# Patient Record
Sex: Female | Born: 1970
Health system: Southern US, Community
[De-identification: ages and names within clinical notes are randomized; demographics above are authoritative.]

## PROBLEM LIST (undated history)

## (undated) DIAGNOSIS — M1990 Unspecified osteoarthritis, unspecified site: Secondary | ICD-10-CM

## (undated) DIAGNOSIS — E785 Hyperlipidemia, unspecified: Secondary | ICD-10-CM

## (undated) DIAGNOSIS — Z789 Other specified health status: Secondary | ICD-10-CM

## (undated) DIAGNOSIS — O139 Gestational [pregnancy-induced] hypertension without significant proteinuria, unspecified trimester: Secondary | ICD-10-CM

## (undated) DIAGNOSIS — F419 Anxiety disorder, unspecified: Secondary | ICD-10-CM

## (undated) DIAGNOSIS — F32A Depression, unspecified: Secondary | ICD-10-CM

## (undated) HISTORY — DX: Depression, unspecified: F32.A

## (undated) HISTORY — PX: WISDOM TOOTH EXTRACTION: SHX21

## (undated) HISTORY — DX: Hyperlipidemia, unspecified: E78.5

## (undated) HISTORY — PX: BACK SURGERY: SHX140

## (undated) HISTORY — DX: Unspecified osteoarthritis, unspecified site: M19.90

## (undated) HISTORY — DX: Anxiety disorder, unspecified: F41.9

## (undated) HISTORY — PX: FRACTURE SURGERY: SHX138

---

## 1999-04-23 HISTORY — PX: EYE SURGERY: SHX253

## 2009-07-07 ENCOUNTER — Encounter: Admission: RE | Admit: 2009-07-07 | Discharge: 2009-07-07 | Payer: Self-pay | Admitting: Neurosurgery

## 2009-10-20 HISTORY — PX: LUMBAR FUSION: SHX111

## 2009-11-01 ENCOUNTER — Ambulatory Visit (HOSPITAL_COMMUNITY): Admission: RE | Admit: 2009-11-01 | Discharge: 2009-11-02 | Payer: Self-pay | Admitting: Neurosurgery

## 2010-05-13 ENCOUNTER — Encounter: Payer: Self-pay | Admitting: Neurosurgery

## 2010-05-13 ENCOUNTER — Encounter: Payer: Self-pay | Admitting: Chiropractor

## 2010-07-08 LAB — BASIC METABOLIC PANEL
BUN: 7 mg/dL (ref 6–23)
CO2: 26 mEq/L (ref 19–32)
Calcium: 8.9 mg/dL (ref 8.4–10.5)
Chloride: 105 mEq/L (ref 96–112)
Creatinine, Ser: 0.71 mg/dL (ref 0.4–1.2)
GFR calc Af Amer: >60 mL/min (ref >60)
GFR calc non Af Amer: >60 mL/min (ref >60)
Glucose, Bld: 101 mg/dL — ABNORMAL HIGH (ref 70–99)
Potassium: 4 mEq/L (ref 3.5–5.1)
Sodium: 137 mEq/L (ref 135–145)

## 2010-07-08 LAB — ABO/RH: ABO/RH(D): O POS

## 2010-07-08 LAB — TYPE AND SCREEN
ABO/RH(D): O POS
Antibody Screen: NEGATIVE

## 2010-07-08 LAB — SURGICAL PCR SCREEN
MRSA, PCR: NEGATIVE
Staphylococcus aureus: NEGATIVE

## 2010-07-08 LAB — CBC
HCT: 39.8 % (ref 36.0–46.0)
Hemoglobin: 13.6 g/dL (ref 12.0–15.0)
MCH: 33.5 pg (ref 26.0–34.0)
MCHC: 34.2 g/dL (ref 30.0–36.0)
MCV: 97.9 fL (ref 78.0–100.0)
Platelets: 262 10*3/uL (ref 150–400)
RBC: 4.07 MIL/uL (ref 3.87–5.11)
RDW: 13.5 % (ref 11.5–15.5)
WBC: 6.1 10*3/uL (ref 4.0–10.5)

## 2010-07-08 LAB — HCG, SERUM, QUALITATIVE: Preg, Serum: NEGATIVE

## 2010-07-22 HISTORY — PX: ORIF CLAVICLE FRACTURE: SUR924

## 2010-08-12 ENCOUNTER — Emergency Department: Payer: Self-pay | Admitting: Emergency Medicine

## 2010-08-15 ENCOUNTER — Ambulatory Visit (HOSPITAL_COMMUNITY): Payer: No Typology Code available for payment source

## 2010-08-15 ENCOUNTER — Ambulatory Visit (HOSPITAL_COMMUNITY)
Admission: RE | Admit: 2010-08-15 | Discharge: 2010-08-15 | Disposition: A | Discharge status: Home / Self Care | Payer: No Typology Code available for payment source | Source: Ambulatory Visit | Attending: Orthopedic Surgery | Admitting: Orthopedic Surgery

## 2010-08-15 DIAGNOSIS — Y92410 Unspecified street and highway as the place of occurrence of the external cause: Secondary | ICD-10-CM | POA: Insufficient documentation

## 2010-08-15 DIAGNOSIS — S42023A Displaced fracture of shaft of unspecified clavicle, initial encounter for closed fracture: Secondary | ICD-10-CM | POA: Insufficient documentation

## 2010-08-15 LAB — CBC
MCH: 31.7 pg (ref 26.0–34.0)
WBC: 8.1 10*3/uL (ref 4.0–10.5)

## 2010-08-15 LAB — URINALYSIS, ROUTINE W REFLEX MICROSCOPIC
Glucose, UA: NEGATIVE mg/dL
Hgb urine dipstick: NEGATIVE

## 2010-08-15 LAB — BASIC METABOLIC PANEL
BUN: 8 mg/dL (ref 6–23)
Calcium: 9.2 mg/dL (ref 8.4–10.5)
Chloride: 106 mEq/L (ref 96–112)
Creatinine, Ser: 0.71 mg/dL (ref 0.4–1.2)
GFR calc Af Amer: >60 mL/min (ref >60)
GFR calc non Af Amer: >60 mL/min (ref >60)
Potassium: 4.3 mEq/L (ref 3.5–5.1)
Sodium: 136 mEq/L (ref 135–145)

## 2010-08-15 LAB — DIFFERENTIAL
Basophils Absolute: 0 10*3/uL (ref 0.0–0.1)
Eosinophils Absolute: 0.3 10*3/uL (ref 0.0–0.7)
Lymphocytes Relative: 22 % (ref 12–46)
Monocytes Absolute: 0.6 10*3/uL (ref 0.1–1.0)
Monocytes Relative: 7 % (ref 3–12)

## 2010-08-15 LAB — SURGICAL PCR SCREEN: MRSA, PCR: NEGATIVE

## 2010-08-16 LAB — URINE CULTURE: Colony Count: NO GROWTH
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
## 2011-01-14 ENCOUNTER — Other Ambulatory Visit (HOSPITAL_COMMUNITY): Payer: Self-pay | Admitting: Gynecology

## 2011-01-14 DIAGNOSIS — N979 Female infertility, unspecified: Secondary | ICD-10-CM

## 2011-01-17 ENCOUNTER — Ambulatory Visit (HOSPITAL_COMMUNITY)
Admission: RE | Admit: 2011-01-17 | Discharge: 2011-01-17 | Disposition: A | Discharge status: Home / Self Care | Payer: BC Managed Care – PPO | Source: Ambulatory Visit | Attending: Gynecology | Admitting: Gynecology

## 2011-01-17 DIAGNOSIS — N979 Female infertility, unspecified: Secondary | ICD-10-CM

## 2011-01-17 MED ORDER — IOHEXOL 300 MG/ML  SOLN: 10.0000 mL | Freq: Once | INTRAMUSCULAR | Status: AC | PRN

## 2011-08-26 ENCOUNTER — Encounter (HOSPITAL_COMMUNITY): Payer: Self-pay | Admitting: Pharmacy Technician
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
## 2011-08-30 ENCOUNTER — Encounter (HOSPITAL_COMMUNITY)
Admission: RE | Admit: 2011-08-30 | Discharge: 2011-08-30 | Disposition: A | Discharge status: Home / Self Care | Payer: No Typology Code available for payment source | Source: Ambulatory Visit | Attending: Orthopedic Surgery | Admitting: Orthopedic Surgery

## 2011-08-30 ENCOUNTER — Encounter (HOSPITAL_COMMUNITY): Payer: Self-pay

## 2011-08-30 HISTORY — DX: Other specified health status: Z78.9

## 2011-08-30 LAB — CBC
HCT: 39.1 % (ref 36.0–46.0)
MCHC: 33.5 g/dL (ref 30.0–36.0)
MCV: 92.9 fL (ref 78.0–100.0)
RDW: 13.1 % (ref 11.5–15.5)

## 2011-08-30 NOTE — Pre-Procedure Instructions (Signed)
20 Sahily Biddle  08/30/2011   Your procedure is scheduled on:  Friday, May 17  Report to [facility] at 0530 AM.  Call this number if you have problems the morning of surgery: [phone]   Remember:   Do not eat food:After Midnight.  May have clear liquids: up to 4 Hours before arrival.  Clear liquids include soda, tea, black coffee, apple or grape juice, broth.  Take these medicines the morning of surgery with A SIP OF WATER: *none**   Do not wear jewelry, make-up or nail polish.  Do not wear lotions, powders, or perfumes. You may wear deodorant.  Do not shave 48 hours prior to surgery.  Do not bring valuables to the hospital.  Contacts, dentures or bridgework may not be worn into surgery.  Leave suitcase in the car. After surgery it may be brought to your room.  For patients admitted to the hospital, checkout time is 11:00 AM the day of discharge.   Patients discharged the day of surgery will not be allowed to drive home.  Name and phone number of your driver: *spouse, Luisa Hart**  Special Instructions: CHG Shower Use Special Wash: 1/2 bottle night before surgery and 1/2 bottle morning of surgery.   Please read over the following fact sheets that you were given: Pain Booklet, Coughing and Deep Breathing, MRSA Information and Surgical Site Infection Prevention

## 2011-08-30 NOTE — Pre-Procedure Instructions (Signed)
20 [name]  08/30/2011   Your procedure is scheduled on:  Fri, May 17 @ 7:30 AM              Report to [facility] at 5:30 AM   Call this number if you have problems the morning of surgery: [phone]   Remember:   Do not eat food:After Midnight.  May have clear liquids: up to 4 Hours before arrival.(until 1:30 am)  Clear liquids include soda, tea, black coffee, apple or grape juice, broth,water    Do not wear jewelry, make-up or nail polish.  Do not wear lotions, powders, or perfumes.   Do not shave 48 hours prior to surgery.  Do not bring valuables to the hospital.  Contacts, dentures or bridgework may not be worn into surgery.  Leave suitcase in the car. After surgery it may be brought to your room.  For patients admitted to the hospital, checkout time is 11:00 AM the day of discharge.   Patients discharged the day of surgery will not be allowed to drive home.

## 2011-09-05 MED ORDER — CEFAZOLIN SODIUM-DEXTROSE 2-3 GM-% IV SOLR
2.0000 g | INTRAVENOUS | Status: AC
  Administered 2011-09-06: 2 g via INTRAVENOUS
  Filled 2011-09-05: qty 50

## 2011-09-06 ENCOUNTER — Ambulatory Visit (HOSPITAL_COMMUNITY)
Admission: RE | Admit: 2011-09-06 | Discharge: 2011-09-06 | Disposition: A | Discharge status: Home / Self Care | Payer: Self-pay | Source: Ambulatory Visit | Attending: Orthopedic Surgery | Admitting: Orthopedic Surgery

## 2011-09-06 ENCOUNTER — Encounter (HOSPITAL_COMMUNITY): Payer: Self-pay | Admitting: Anesthesiology

## 2011-09-06 ENCOUNTER — Encounter (HOSPITAL_COMMUNITY)
Admission: RE | Disposition: A | Discharge status: Home / Self Care | Payer: Self-pay | Source: Ambulatory Visit | Attending: Orthopedic Surgery

## 2011-09-06 ENCOUNTER — Encounter (HOSPITAL_COMMUNITY): Payer: Self-pay | Admitting: *Deleted

## 2011-09-06 ENCOUNTER — Ambulatory Visit (HOSPITAL_COMMUNITY): Payer: Self-pay | Admitting: Anesthesiology

## 2011-09-06 DIAGNOSIS — Z472 Encounter for removal of internal fixation device: Secondary | ICD-10-CM | POA: Insufficient documentation

## 2011-09-06 DIAGNOSIS — M25519 Pain in unspecified shoulder: Secondary | ICD-10-CM | POA: Insufficient documentation

## 2011-09-06 DIAGNOSIS — Z01812 Encounter for preprocedural laboratory examination: Secondary | ICD-10-CM | POA: Insufficient documentation

## 2011-09-06 HISTORY — PX: HARDWARE REMOVAL: SHX979

## 2011-09-06 SURGERY — REMOVAL, HARDWARE
Anesthesia: General | Site: Shoulder | Laterality: Right | Wound class: Clean

## 2011-09-06 MED ORDER — SODIUM CHLORIDE 0.9 % IV SOLN: INTRAVENOUS | Status: DC

## 2011-09-06 MED ORDER — ACETAMINOPHEN 10 MG/ML IV SOLN: 1000.0000 mg | Freq: Once | INTRAVENOUS | Status: DC | PRN

## 2011-09-06 MED ORDER — ONDANSETRON HCL 4 MG/2ML IJ SOLN: 4.0000 mg | Freq: Once | INTRAMUSCULAR | Status: DC | PRN

## 2011-09-06 MED ORDER — OXYCODONE-ACETAMINOPHEN 5-325 MG PO TABS
2.0000 | ORAL_TABLET | Freq: Once | ORAL | Status: AC
  Administered 2011-09-06: 2 via ORAL

## 2011-09-06 MED ORDER — LIDOCAINE-EPINEPHRINE 1 %-1:100000 IJ SOLN
INTRAMUSCULAR | Status: DC | PRN
  Administered 2011-09-06: 12 mL
  Administered 2011-09-06: 8 mL

## 2011-09-06 MED ORDER — CHLORHEXIDINE GLUCONATE 4 % EX LIQD: 60.0000 mL | Freq: Once | CUTANEOUS | Status: DC

## 2011-09-06 MED ORDER — PROPOFOL 10 MG/ML IV EMUL
INTRAVENOUS | Status: DC | PRN
  Administered 2011-09-06: 75 ug/kg/min via INTRAVENOUS

## 2011-09-06 MED ORDER — ONDANSETRON HCL 4 MG/2ML IJ SOLN
INTRAMUSCULAR | Status: DC | PRN
  Administered 2011-09-06: 4 mg via INTRAVENOUS

## 2011-09-06 MED ORDER — HYDROMORPHONE HCL PF 1 MG/ML IJ SOLN: 0.2500 mg | INTRAMUSCULAR | Status: DC | PRN

## 2011-09-06 MED ORDER — 0.9 % SODIUM CHLORIDE (POUR BTL) OPTIME
TOPICAL | Status: DC | PRN
  Administered 2011-09-06: 1000 mL

## 2011-09-06 MED ORDER — LIDOCAINE HCL (CARDIAC) 20 MG/ML IV SOLN
INTRAVENOUS | Status: DC | PRN
  Administered 2011-09-06: 30 mg via INTRAVENOUS

## 2011-09-06 MED ORDER — MIDAZOLAM HCL 5 MG/5ML IJ SOLN
INTRAMUSCULAR | Status: DC | PRN
  Administered 2011-09-06: 1 mg via INTRAVENOUS
  Administered 2011-09-06: 2 mg via INTRAVENOUS
  Administered 2011-09-06: 1 mg via INTRAVENOUS

## 2011-09-06 MED ORDER — OXYCODONE-ACETAMINOPHEN 5-325 MG PO TABS: 1.0000 | ORAL_TABLET | ORAL | Status: AC | PRN

## 2011-09-06 MED ORDER — FENTANYL CITRATE 0.05 MG/ML IJ SOLN
INTRAMUSCULAR | Status: DC | PRN
  Administered 2011-09-06 (×3): 50 ug via INTRAVENOUS
  Administered 2011-09-06 (×4): 25 ug via INTRAVENOUS

## 2011-09-06 MED ORDER — LACTATED RINGERS IV SOLN
INTRAVENOUS | Status: DC | PRN
  Administered 2011-09-06: 07:00:00 via INTRAVENOUS

## 2011-09-06 SURGICAL SUPPLY — 54 items
BANDAGE ELASTIC 4 VELCRO ST LF (GAUZE/BANDAGES/DRESSINGS) IMPLANT
BANDAGE ELASTIC 6 VELCRO ST LF (GAUZE/BANDAGES/DRESSINGS) IMPLANT
BANDAGE ESMARK 6X9 LF (GAUZE/BANDAGES/DRESSINGS) IMPLANT
BANDAGE GAUZE ELAST BULKY 4 IN (GAUZE/BANDAGES/DRESSINGS) IMPLANT
BNDG COHESIVE 4X5 TAN STRL (GAUZE/BANDAGES/DRESSINGS) IMPLANT
BNDG ESMARK 6X9 LF (GAUZE/BANDAGES/DRESSINGS)
CLOTH BEACON ORANGE TIMEOUT ST (SAFETY) ×2 IMPLANT
COVER SURGICAL LIGHT HANDLE (MISCELLANEOUS) ×2 IMPLANT
CUFF TOURNIQUET SINGLE 18IN (TOURNIQUET CUFF) IMPLANT
CUFF TOURNIQUET SINGLE 24IN (TOURNIQUET CUFF) IMPLANT
CUFF TOURNIQUET SINGLE 34IN LL (TOURNIQUET CUFF) IMPLANT
CUFF TOURNIQUET SINGLE 44IN (TOURNIQUET CUFF) IMPLANT
DRAPE C-ARM 42X72 X-RAY (DRAPES) IMPLANT
DRAPE EXTREMITY T 121X128X90 (DRAPE) ×2 IMPLANT
DRAPE INCISE IOBAN 66X45 STRL (DRAPES) ×2 IMPLANT
DRAPE LAPAROTOMY T 102X78X121 (DRAPES) IMPLANT
DRAPE ORTHO SPLIT 77X108 STRL (DRAPES)
DRAPE PROXIMA HALF (DRAPES) IMPLANT
DRAPE SURG ORHT 6 SPLT 77X108 (DRAPES) IMPLANT
DRSG EMULSION OIL 3X3 NADH (GAUZE/BANDAGES/DRESSINGS) IMPLANT
DRSG PAD ABDOMINAL 8X10 ST (GAUZE/BANDAGES/DRESSINGS) IMPLANT
ELECT REM PT RETURN 9FT ADLT (ELECTROSURGICAL) ×2
ELECTRODE REM PT RTRN 9FT ADLT (ELECTROSURGICAL) ×1 IMPLANT
GLOVE BIOGEL PI ORTHO PRO 7.5 (GLOVE)
GLOVE BIOGEL PI ORTHO PRO SZ7 (GLOVE) ×2
GLOVE BIOGEL PI ORTHO PRO SZ8 (GLOVE) ×1
GLOVE ORTHO TXT STRL SZ7.5 (GLOVE) IMPLANT
GLOVE PI ORTHO PRO STRL 7.5 (GLOVE) IMPLANT
GLOVE PI ORTHO PRO STRL SZ7 (GLOVE) ×2 IMPLANT
GLOVE PI ORTHO PRO STRL SZ8 (GLOVE) ×1 IMPLANT
GLOVE SURG ORTHO 8.5 STRL (GLOVE) ×2 IMPLANT
GLOVE SURG SS PI 7.0 STRL IVOR (GLOVE) ×2 IMPLANT
GOWN STRL NON-REIN LRG LVL3 (GOWN DISPOSABLE) IMPLANT
GOWN STRL REIN XL XLG (GOWN DISPOSABLE) ×6 IMPLANT
KIT BASIN OR (CUSTOM PROCEDURE TRAY) ×2 IMPLANT
KIT ROOM TURNOVER OR (KITS) ×2 IMPLANT
MANIFOLD NEPTUNE II (INSTRUMENTS) IMPLANT
NEEDLE 22X1 1/2 (OR ONLY) (NEEDLE) ×2 IMPLANT
NEEDLE HYPO 25GX1X1/2 BEV (NEEDLE) ×4 IMPLANT
NS IRRIG 1000ML POUR BTL (IV SOLUTION) ×2 IMPLANT
PACK GENERAL/GYN (CUSTOM PROCEDURE TRAY) ×2 IMPLANT
PAD ARMBOARD 7.5X6 YLW CONV (MISCELLANEOUS) ×2 IMPLANT
PAD CAST 4YDX4 CTTN HI CHSV (CAST SUPPLIES) IMPLANT
PADDING CAST COTTON 4X4 STRL (CAST SUPPLIES)
SPONGE GAUZE 4X4 12PLY (GAUZE/BANDAGES/DRESSINGS) ×2 IMPLANT
STAPLER VISISTAT 35W (STAPLE) IMPLANT
STOCKINETTE IMPERVIOUS 9X36 MD (GAUZE/BANDAGES/DRESSINGS) IMPLANT
SUT ETHILON 3 0 PS 1 (SUTURE) ×2 IMPLANT
SUT VIC AB 2-0 CT1 27 (SUTURE) ×1
SUT VIC AB 2-0 CT1 TAPERPNT 27 (SUTURE) ×1 IMPLANT
SYR CONTROL 10ML LL (SYRINGE) ×4 IMPLANT
TOWEL OR 17X24 6PK STRL BLUE (TOWEL DISPOSABLE) ×2 IMPLANT
TOWEL OR 17X26 10 PK STRL BLUE (TOWEL DISPOSABLE) ×2 IMPLANT
WATER STERILE IRR 1000ML POUR (IV SOLUTION) IMPLANT

## 2011-09-06 NOTE — Brief Op Note (Signed)
09/06/2011  8:37 AM  PATIENT:  [name]  41 y.o. female  PRE-OPERATIVE DIAGNOSIS:  retained hardware - clavicle pin  POST-OPERATIVE DIAGNOSIS:  retained hardware - clavicle pin  PROCEDURE:  Procedure(s) (LRB): HARDWARE REMOVAL (Right), clavicle pin removal SURGEON:  Surgeon(s) and Role:    * Verlee Rossetti, MD - Primary  PHYSICIAN ASSISTANT:   ASSISTANTS: none   ANESTHESIA:   MAC  EBL:  Total I/O In: 400 [I.V.:400] Out: -   BLOOD ADMINISTERED:none  DRAINS: none   LOCAL MEDICATIONS USED:  LIDOCAINE   SPECIMEN:  No Specimen  DISPOSITION OF SPECIMEN:  N/A  COUNTS:  YES  TOURNIQUET:  * No tourniquets in log *  DICTATION: .Other Dictation: Dictation Number [id number]  PLAN OF CARE: Discharge to home after PACU  PATIENT DISPOSITION:  PACU - hemodynamically stable.   Delay start of Pharmacological VTE agent (>24hrs) due to surgical blood loss or risk of bleeding: not applicable

## 2011-09-06 NOTE — Anesthesia Preprocedure Evaluation (Addendum)
Anesthesia Evaluation  Patient identified by MRN, date of birth, ID band Patient awake    Reviewed: Allergy & Precautions, H&P , NPO status , Patient's Chart, lab work & pertinent test results, reviewed documented beta blocker date and time   Airway Mallampati: II TM Distance: >3 FB Neck ROM: Full    Dental  (+) Teeth Intact and Dental Advisory Given   Pulmonary  breath sounds clear to auscultation        Cardiovascular Rhythm:Regular Rate:Normal     Neuro/Psych    GI/Hepatic   Endo/Other    Renal/GU      Musculoskeletal   Abdominal   Peds  Hematology   Anesthesia Other Findings   Reproductive/Obstetrics                          Anesthesia Physical Anesthesia Plan  ASA: II  Anesthesia Plan: General and MAC   Post-op Pain Management:    Induction: Intravenous  Airway Management Planned:   Additional Equipment:   Intra-op Plan:   Post-operative Plan:   Informed Consent: I have reviewed the patients History and Physical, chart, labs and discussed the procedure including the risks, benefits and alternatives for the proposed anesthesia with the patient or authorized representative who has indicated his/her understanding and acceptance.   Dental advisory given  Plan Discussed with: CRNA and Anesthesiologist  Anesthesia Plan Comments: (One Year S/P R. Clavicle Fracture with painful hardware    Kipp Brood, MD)       Anesthesia Quick Evaluation

## 2011-09-06 NOTE — Interval H&P Note (Signed)
History and Physical Interval Note:  09/06/2011 7:36 AM  [name]  has presented today for surgery, with the diagnosis of retained hardware - clavicle pin  The various methods of treatment have been discussed with the patient and family. After consideration of risks, benefits and other options for treatment, the patient has consented to  Procedure(s) (LRB): HARDWARE REMOVAL (Right) as a surgical intervention .  The patients' history has been reviewed, patient examined, no change in status, stable for surgery.  I have reviewed the patients' chart and labs.  Questions were answered to the patient's satisfaction.     Kaprice Kage,STEVEN R

## 2011-09-06 NOTE — Discharge Instructions (Addendum)
Ice to the right shoulder. Activity as tolerated. Follow up in 2 weeks  Instructions Following General Anesthetic, Adult A nurse specialized in giving anesthesia (anesthetist) or a doctor specialized in giving anesthesia (anesthesiologist) gave you a medicine that made you sleep while a procedure was performed. For as long as 24 hours following this procedure, you may feel:  Dizzy.   Weak.   Drowsy.  AFTER THE PROCEDURE After surgery, you will be taken to the recovery area where a nurse will monitor your progress. You will be allowed to go home when you are awake, stable, taking fluids well, and without complications. For the first 24 hours following an anesthetic:  Have a responsible person with you.   Do not drive a car. If you are alone, do not take public transportation.   Do not drink alcohol.   Do not take medicine that has not been prescribed by your caregiver.   Do not sign important papers or make important decisions.   You may resume normal diet and activities as directed.   Change bandages (dressings) as directed.   Only take over-the-counter or prescription medicines for pain, discomfort, or fever as directed by your caregiver.  If you have questions or problems that seem related to the anesthetic, call the hospital and ask for the anesthetist or anesthesiologist on call. SEEK IMMEDIATE MEDICAL CARE IF:   You develop a rash.   You have difficulty breathing.   You have chest pain.   You develop any allergic problems.  Document Released: 07/15/2000 Document Revised: 03/28/2011 Document Reviewed: 02/23/2007 [facility] Patient Information 2012 [place], [place].

## 2011-09-06 NOTE — Transfer of Care (Signed)
Immediate Anesthesia Transfer of Care Note  Patient: [name]  Procedure(s) Performed: Procedure(s) (LRB): HARDWARE REMOVAL (Right)  Patient Location: PACU  Anesthesia Type: MAC  Level of Consciousness: awake, alert  and oriented  Airway & Oxygen Therapy: Patient Spontanous Breathing and Patient connected to nasal cannula oxygen  Post-op Assessment: Report given to PACU RN, Post -op Vital signs reviewed and stable and Patient moving all extremities  Post vital signs: Reviewed and stable  Complications: No apparent anesthesia complications

## 2011-09-06 NOTE — Anesthesia Postprocedure Evaluation (Signed)
  Anesthesia Post-op Note  Patient: [name]  Procedure(s) Performed: Procedure(s) (LRB): HARDWARE REMOVAL (Right)  Patient Location: PACU  Anesthesia Type: MAC  Level of Consciousness: awake, alert  and oriented  Airway and Oxygen Therapy: Patient Spontanous Breathing  Post-op Pain: mild  Post-op Assessment: Post-op Vital signs reviewed  Post-op Vital Signs: stable  Complications: No apparent anesthesia complications

## 2011-09-06 NOTE — Op Note (Signed)
NAMEJESSE, [name]            ACCOUNT NO.:  [id number]  MEDICAL RECORD NO.:  [id number]  LOCATION:  [facility]                         FACILITY:  [facility]  PHYSICIAN:  Almedia Balls. Ranell Patrick, M.D. DATE OF BIRTH:  18-Jun-1970  DATE OF PROCEDURE:  09/06/2011 DATE OF DISCHARGE:                              OPERATIVE REPORT   PREOPERATIVE DIAGNOSIS:  Retained hardware, right shoulder.  POSTOPERATIVE DIAGNOSIS:  Retained hardware, right shoulder.  PROCEDURE PERFORMED:  Implant removal deep right shoulder, clavicle pin.  SURGEON:  Almedia Balls. Ranell Patrick, MD  ASSISTANTS:  None.  ANESTHESIA:  MAC anesthesia was used.  ESTIMATED BLOOD LOSS:  Minimal.  FLUID REPLACED:  200 mL crystalloid.  INSTRUMENT COUNTS:  Correct.  There were no complications.  Perioperative antibiotics were given.  INDICATIONS:  The patient is a 41 year old female who is status post right clavicle fracture requiring surgery.  The patient had a clavicle pin placed.  The patient does complain of painful hardware posteriorly where the clavicle pin is exiting the posterior clavicle with the medial lateral nuts on it.  We discussed options with the patient.  She would like to have the pin removed.  Informed consent obtained.  DESCRIPTION OF PROCEDURE:  After adequate level of anesthesia was achieved, the patient was positioned supine on the operating room table. The right shoulder and hip were bumped up, sterilely prepped and draped the posterior shoulder.  We localized the pin.  I was able to palpate through the skin and infiltrated the skin with 1% lidocaine with epinephrine.  I made our skin incision after time-out was called.  We dissected down through subcutaneous tissues using hemostat and Metzenbaum scissors, identified the pin, freed in the soft tissue around it and placed the medial wrench on the medial nuts and then started back the pin out.  We were able to get about an inch and a half of the pin and the nuts backing  out until the pin had significant amount of tension in it and then sheared it off.  I was able to take my finger and instruments and palpate back down along the posterior clavicle and could not feel any metal sticking out, so this must be sure about at the bone interface, and we took the pin and the nuts off on the operating room table and held that clean for her.  We thoroughly irrigated the wound and closed with interrupted nylon closure and a sterile bandage. The patient tolerated the surgery well.     Almedia Balls. Ranell Patrick, M.D.     SRN/MEDQ  D:  09/06/2011  T:  09/06/2011  Job:  [id number]

## 2011-09-09 ENCOUNTER — Encounter (HOSPITAL_COMMUNITY): Payer: Self-pay | Admitting: Orthopedic Surgery

## 2012-04-22 NOTE — L&D Delivery Note (Signed)
Operative Delivery Note At 3:32 PM a viable female was delivered via .  Presentation: vertex; Position: Right,, Occiput,, Anterior; Station: +2.  Verbal consent: obtained from patient.  Risks and benefits discussed in detail.  Risks include, but are not limited to the risks of anesthesia, bleeding, infection, damage to maternal tissues, fetal cephalhematoma.  There is also the risk of inability to effect vaginal delivery of the head, or shoulder dystocia that cannot be resolved by established maneuvers, leading to the need for emergency cesarean section.  APGAR: 8, 9; weight pending Placenta status:spontaneously with 3 vessel cord but short , .   Cord:  with the following complications: tight nuchal cord x 1 .  Cord pH: not obtained  Anesthesia: epidural  Instruments: mushroom Episiotomy: mediolateral Lacerations: none Suture Repair: 3.0 chromic Est. Blood Loss (mL): 300  Mom to postpartum.  Baby to Couplet care / Skin to Skin.  Matisse Roskelley L 03/27/2013, 3:52 PM

## 2012-08-28 LAB — OB RESULTS CONSOLE RPR: RPR: NONREACTIVE

## 2012-08-28 LAB — OB RESULTS CONSOLE HIV ANTIBODY (ROUTINE TESTING): HIV: NONREACTIVE

## 2012-08-28 LAB — OB RESULTS CONSOLE RUBELLA ANTIBODY, IGM: Rubella: IMMUNE

## 2012-08-28 LAB — OB RESULTS CONSOLE HEPATITIS B SURFACE ANTIGEN: Hepatitis B Surface Ag: NEGATIVE

## 2013-03-27 ENCOUNTER — Encounter (HOSPITAL_COMMUNITY): Payer: Self-pay | Admitting: *Deleted

## 2013-03-27 ENCOUNTER — Encounter (HOSPITAL_COMMUNITY): Payer: BC Managed Care – PPO | Admitting: Anesthesiology

## 2013-03-27 ENCOUNTER — Inpatient Hospital Stay (HOSPITAL_COMMUNITY): Payer: BC Managed Care – PPO | Admitting: Anesthesiology

## 2013-03-27 ENCOUNTER — Inpatient Hospital Stay (HOSPITAL_COMMUNITY)
Admission: AD | Admit: 2013-03-27 | Discharge: 2013-03-29 | DRG: 775 | Disposition: A | Discharge status: Home / Self Care | Payer: BC Managed Care – PPO | Source: Ambulatory Visit | Attending: Obstetrics and Gynecology | Admitting: Obstetrics and Gynecology

## 2013-03-27 DIAGNOSIS — IMO0001 Reserved for inherently not codable concepts without codable children: Secondary | ICD-10-CM

## 2013-03-27 HISTORY — DX: Gestational (pregnancy-induced) hypertension without significant proteinuria, unspecified trimester: O13.9

## 2013-03-27 LAB — POCT FERN TEST: POCT Fern Test: POSITIVE

## 2013-03-27 LAB — CBC
MCH: 32.9 pg (ref 26.0–34.0)
MCHC: 36.3 g/dL — ABNORMAL HIGH (ref 30.0–36.0)
Platelets: 215 10*3/uL (ref 150–400)
RBC: 4.31 MIL/uL (ref 3.87–5.11)
RDW: 13.5 % (ref 11.5–15.5)

## 2013-03-27 LAB — RPR: RPR Ser Ql: NONREACTIVE

## 2013-03-27 MED ORDER — DIPHENHYDRAMINE HCL 25 MG PO CAPS: 25.0000 mg | ORAL_CAPSULE | Freq: Four times a day (QID) | ORAL | Status: DC | PRN

## 2013-03-27 MED ORDER — IBUPROFEN 600 MG PO TABS: 600.0000 mg | ORAL_TABLET | Freq: Four times a day (QID) | ORAL | Status: DC | PRN

## 2013-03-27 MED ORDER — SIMETHICONE 80 MG PO CHEW: 80.0000 mg | CHEWABLE_TABLET | ORAL | Status: DC | PRN

## 2013-03-27 MED ORDER — BUPIVACAINE HCL (PF) 0.25 % IJ SOLN
INTRAMUSCULAR | Status: AC
  Filled 2013-03-27: qty 30

## 2013-03-27 MED ORDER — BENZOCAINE-MENTHOL 20-0.5 % EX AERO
1.0000 "application " | INHALATION_SPRAY | CUTANEOUS | Status: DC | PRN
  Administered 2013-03-27 – 2013-03-28 (×2): 1 via TOPICAL
  Filled 2013-03-27 (×2): qty 56

## 2013-03-27 MED ORDER — ACETAMINOPHEN 325 MG PO TABS: 650.0000 mg | ORAL_TABLET | ORAL | Status: DC | PRN

## 2013-03-27 MED ORDER — EPHEDRINE 5 MG/ML INJ
10.0000 mg | INTRAVENOUS | Status: DC | PRN
  Filled 2013-03-27: qty 2

## 2013-03-27 MED ORDER — ZOLPIDEM TARTRATE 5 MG PO TABS: 5.0000 mg | ORAL_TABLET | Freq: Every evening | ORAL | Status: DC | PRN

## 2013-03-27 MED ORDER — IBUPROFEN 600 MG PO TABS
600.0000 mg | ORAL_TABLET | Freq: Four times a day (QID) | ORAL | Status: DC
  Administered 2013-03-27 – 2013-03-29 (×9): 600 mg via ORAL
  Filled 2013-03-27 (×9): qty 1

## 2013-03-27 MED ORDER — BISACODYL 10 MG RE SUPP: 10.0000 mg | Freq: Every day | RECTAL | Status: DC | PRN

## 2013-03-27 MED ORDER — MEASLES, MUMPS & RUBELLA VAC ~~LOC~~ INJ
0.5000 mL | INJECTION | Freq: Once | SUBCUTANEOUS | Status: DC
  Filled 2013-03-27: qty 0.5

## 2013-03-27 MED ORDER — OXYCODONE-ACETAMINOPHEN 5-325 MG PO TABS: 1.0000 | ORAL_TABLET | ORAL | Status: DC | PRN

## 2013-03-27 MED ORDER — DIPHENHYDRAMINE HCL 50 MG/ML IJ SOLN: 12.5000 mg | INTRAMUSCULAR | Status: DC | PRN

## 2013-03-27 MED ORDER — FENTANYL 2.5 MCG/ML BUPIVACAINE 1/10 % EPIDURAL INFUSION (WH - ANES)
14.0000 mL/h | INTRAMUSCULAR | Status: DC | PRN
  Filled 2013-03-27: qty 125

## 2013-03-27 MED ORDER — ONDANSETRON HCL 4 MG/2ML IJ SOLN: 4.0000 mg | INTRAMUSCULAR | Status: DC | PRN

## 2013-03-27 MED ORDER — LACTATED RINGERS IV SOLN
500.0000 mL | Freq: Once | INTRAVENOUS | Status: AC
  Administered 2013-03-27: 500 mL via INTRAVENOUS

## 2013-03-27 MED ORDER — DIBUCAINE 1 % RE OINT
1.0000 "application " | TOPICAL_OINTMENT | RECTAL | Status: DC | PRN
  Administered 2013-03-27: 1 via RECTAL
  Filled 2013-03-27: qty 28

## 2013-03-27 MED ORDER — LIDOCAINE HCL (PF) 1 % IJ SOLN
INTRAMUSCULAR | Status: DC | PRN
  Administered 2013-03-27: 5 mL
  Administered 2013-03-27: 4 mL

## 2013-03-27 MED ORDER — OXYTOCIN BOLUS FROM INFUSION
500.0000 mL | INTRAVENOUS | Status: DC
  Administered 2013-03-27: 500 mL via INTRAVENOUS

## 2013-03-27 MED ORDER — PHENYLEPHRINE 40 MCG/ML (10ML) SYRINGE FOR IV PUSH (FOR BLOOD PRESSURE SUPPORT)
80.0000 ug | PREFILLED_SYRINGE | INTRAVENOUS | Status: DC | PRN
  Filled 2013-03-27: qty 10
  Filled 2013-03-27: qty 2

## 2013-03-27 MED ORDER — OXYCODONE-ACETAMINOPHEN 5-325 MG PO TABS
1.0000 | ORAL_TABLET | ORAL | Status: DC | PRN
  Filled 2013-03-27: qty 1

## 2013-03-27 MED ORDER — WITCH HAZEL-GLYCERIN EX PADS
1.0000 "application " | MEDICATED_PAD | CUTANEOUS | Status: DC | PRN
  Administered 2013-03-27: 1 via TOPICAL

## 2013-03-27 MED ORDER — LIDOCAINE HCL (PF) 1 % IJ SOLN
30.0000 mL | INTRAMUSCULAR | Status: DC | PRN
  Administered 2013-03-27: 30 mL via SUBCUTANEOUS
  Filled 2013-03-27 (×3): qty 30

## 2013-03-27 MED ORDER — CITRIC ACID-SODIUM CITRATE 334-500 MG/5ML PO SOLN
30.0000 mL | ORAL | Status: DC | PRN
  Filled 2013-03-27: qty 15

## 2013-03-27 MED ORDER — ONDANSETRON HCL 4 MG PO TABS: 4.0000 mg | ORAL_TABLET | ORAL | Status: DC | PRN

## 2013-03-27 MED ORDER — PHENYLEPHRINE 40 MCG/ML (10ML) SYRINGE FOR IV PUSH (FOR BLOOD PRESSURE SUPPORT)
80.0000 ug | PREFILLED_SYRINGE | INTRAVENOUS | Status: DC | PRN
  Filled 2013-03-27: qty 2

## 2013-03-27 MED ORDER — FENTANYL 2.5 MCG/ML BUPIVACAINE 1/10 % EPIDURAL INFUSION (WH - ANES)
INTRAMUSCULAR | Status: DC | PRN
  Administered 2013-03-27: 14 mL/h via EPIDURAL

## 2013-03-27 MED ORDER — LACTATED RINGERS IV SOLN
INTRAVENOUS | Status: DC
  Administered 2013-03-27: 13:00:00 via INTRAVENOUS

## 2013-03-27 MED ORDER — LANOLIN HYDROUS EX OINT: TOPICAL_OINTMENT | CUTANEOUS | Status: DC | PRN

## 2013-03-27 MED ORDER — LACTATED RINGERS IV SOLN
500.0000 mL | INTRAVENOUS | Status: DC | PRN
  Administered 2013-03-27: 200 mL via INTRAVENOUS
  Administered 2013-03-27: 1000 mL via INTRAVENOUS

## 2013-03-27 MED ORDER — MEDROXYPROGESTERONE ACETATE 150 MG/ML IM SUSP: 150.0000 mg | INTRAMUSCULAR | Status: DC | PRN

## 2013-03-27 MED ORDER — PRENATAL MULTIVITAMIN CH
1.0000 | ORAL_TABLET | Freq: Every day | ORAL | Status: DC
  Administered 2013-03-28 – 2013-03-29 (×2): 1 via ORAL
  Filled 2013-03-27 (×2): qty 1

## 2013-03-27 MED ORDER — FLEET ENEMA 7-19 GM/118ML RE ENEM: 1.0000 | ENEMA | Freq: Every day | RECTAL | Status: DC | PRN

## 2013-03-27 MED ORDER — EPHEDRINE 5 MG/ML INJ
10.0000 mg | INTRAVENOUS | Status: DC | PRN
  Filled 2013-03-27: qty 4
  Filled 2013-03-27: qty 2

## 2013-03-27 MED ORDER — SENNOSIDES-DOCUSATE SODIUM 8.6-50 MG PO TABS
2.0000 | ORAL_TABLET | ORAL | Status: DC
  Administered 2013-03-28 – 2013-03-29 (×3): 2 via ORAL
  Filled 2013-03-27 (×2): qty 2

## 2013-03-27 MED ORDER — ONDANSETRON HCL 4 MG/2ML IJ SOLN: 4.0000 mg | Freq: Four times a day (QID) | INTRAMUSCULAR | Status: DC | PRN

## 2013-03-27 MED ORDER — TETANUS-DIPHTH-ACELL PERTUSSIS 5-2.5-18.5 LF-MCG/0.5 IM SUSP: 0.5000 mL | Freq: Once | INTRAMUSCULAR | Status: DC

## 2013-03-27 MED ORDER — OXYTOCIN 40 UNITS IN LACTATED RINGERS INFUSION - SIMPLE MED
62.5000 mL/h | INTRAVENOUS | Status: DC
  Administered 2013-03-27: 62.5 mL/h via INTRAVENOUS
  Filled 2013-03-27: qty 1000

## 2013-03-27 NOTE — Anesthesia Procedure Notes (Signed)
Epidural Patient location during procedure: OB Start time: 03/27/2013 9:35 AM  Staffing Anesthesiologist: Jospeh Mangel A. Performed by: anesthesiologist   Preanesthetic Checklist Completed: patient identified, site marked, surgical consent, pre-op evaluation, timeout performed, IV checked, risks and benefits discussed and monitors and equipment checked  Epidural Patient position: sitting Prep: site prepped and draped and DuraPrep Patient monitoring: continuous pulse ox and blood pressure Approach: midline Injection technique: LOR air  Needle:  Needle type: Tuohy  Needle gauge: 17 G Needle length: 9 cm and 9 Needle insertion depth: 5 cm cm Catheter type: closed end flexible Catheter size: 19 Gauge Catheter at skin depth: 10 cm Test dose: negative and Other  Assessment Events: blood not aspirated, injection not painful, no injection resistance, negative IV test and no paresthesia  Additional Notes Patient identified. Risks and benefits discussed including failed block, incomplete  Pain control, post dural puncture headache, nerve damage, paralysis, blood pressure Changes, nausea, vomiting, reactions to medications-both toxic and allergic and post Partum back pain. All questions were answered. Patient expressed understanding and wished to proceed. Sterile technique was used throughout procedure. Epidural site was Dressed with sterile barrier dressing. No paresthesias, signs of intravascular injection Or signs of intrathecal spread were encountered.  Patient was more comfortable after the epidural was dosed. Please see RN's note for documentation of vital signs and FHR which are stable.

## 2013-03-27 NOTE — H&P (Signed)
[name] is a 42 y.o. female presenting for SROM this am clear fluid.Patient is having regular strong contractions. Maternal Medical History:  Reason for admission: Rupture of membranes and contractions.   Contractions: Frequency: regular.   Perceived severity is moderate.    Fetal activity: Perceived fetal activity is normal.    Prenatal complications: no prenatal complications   OB History   Grav Para Term Preterm Abortions TAB SAB Ect Mult Living   1              Past Medical History  Diagnosis Date  . No pertinent past medical history     denies health problems  . Pregnancy induced hypertension    Past Surgical History  Procedure Laterality Date  . Orif clavicle fracture  07-2010  . Back surgery    . Lumbar fusion  10-2009  . Fracture surgery    . Eye surgery  2001    Lasik surgery   . Hardware removal  09/06/2011    Procedure: HARDWARE REMOVAL;  Surgeon: Verlee Rossetti, MD;  Location: [facility] OR;  Service: Orthopedics;  Laterality: Right;  Removal of right clavicle pin   Family History: family history is negative for Anesthesia problems and Alcohol abuse. Social History:  reports that she has never smoked. She has never used smokeless tobacco. She reports that she drinks alcohol. She reports that she does not use illicit drugs.   Prenatal Transfer Tool  Maternal Diabetes: No Genetic Screening: Normal Maternal Ultrasounds/Referrals: Normal Fetal Ultrasounds or other Referrals:  None Maternal Substance Abuse:  No Significant Maternal Medications:  None Significant Maternal Lab Results:  None Other Comments:  None  Review of Systems  All other systems reviewed and are negative.    Dilation: 3.5 Effacement (%): 100 Station: -2 Exam by:: Quintella Baton RNC Blood pressure 171/98, pulse 92, temperature 97.3 F (36.3 C), temperature source Oral, resp. rate 30, SpO2 97.00%. Maternal Exam:  Uterine Assessment: Contraction strength is moderate.  Contraction  frequency is regular.   Abdomen: Patient reports no abdominal tenderness. Fetal presentation: vertex  Introitus: Normal vulva.   Fetal Exam Fetal Monitor Review: Variability: moderate (6-25 bpm).   Pattern: accelerations present.       Physical Exam  Nursing note and vitals reviewed. Constitutional: She appears well-developed.  HENT:  Head: Normocephalic.  Eyes: Pupils are equal, round, and reactive to light.  Neck: Normal range of motion.  Cardiovascular: Normal rate.   Respiratory: Effort normal.  GI: Soft.    Prenatal labs: ABO, Rh:   Antibody:   Rubella:   RPR:    HBsAg:    HIV:    GBS:     Assessment/Plan: IUP at 39 w 2 days SROM Labor Anticipate NSVD   [name] 03/27/2013, 7:43 AM

## 2013-03-27 NOTE — Progress Notes (Signed)
Very difficult to monitor due to pt's positions and movement

## 2013-03-27 NOTE — MAU Note (Signed)
SROM about 0500 and contractions since 0200

## 2013-03-27 NOTE — Progress Notes (Signed)
Pt moves and gets on all fours or stands with contractions making fetal monitoring challenging

## 2013-03-27 NOTE — Progress Notes (Signed)
Vaginal delivery of live, viable female by Dr. Vincente Poli at [phone].

## 2013-03-27 NOTE — Progress Notes (Signed)
Report called to Dana RN in BS. Pt to BS via w/c 

## 2013-03-27 NOTE — Anesthesia Preprocedure Evaluation (Addendum)
Anesthesia Evaluation  Patient identified by MRN, date of birth, ID band Patient awake    Reviewed: Allergy & Precautions, H&P , Patient's Chart, lab work & pertinent test results  Airway Mallampati: III TM Distance: >3 FB Neck ROM: Full    Dental no notable dental hx. (+) Teeth Intact   Pulmonary neg pulmonary ROS,  breath sounds clear to auscultation  Pulmonary exam normal       Cardiovascular hypertension, Rhythm:Regular Rate:Normal  PIH   Neuro/Psych negative neurological ROS  negative psych ROS   GI/Hepatic negative GI ROS, Neg liver ROS,   Endo/Other  Obesity  Renal/GU negative Renal ROS  negative genitourinary   Musculoskeletal   Abdominal (+) + obese,   Peds  Hematology negative hematology ROS (+)   Anesthesia Other Findings   Reproductive/Obstetrics AMA PIH                          Anesthesia Physical Anesthesia Plan  ASA: II  Anesthesia Plan: Epidural   Post-op Pain Management:    Induction:   Airway Management Planned: Natural Airway  Additional Equipment:   Intra-op Plan:   Post-operative Plan:   Informed Consent: I have reviewed the patients History and Physical, chart, labs and discussed the procedure including the risks, benefits and alternatives for the proposed anesthesia with the patient or authorized representative who has indicated his/her understanding and acceptance.     Plan Discussed with: Anesthesiologist  Anesthesia Plan Comments:         Anesthesia Quick Evaluation

## 2013-03-27 NOTE — Progress Notes (Signed)
Dr Vincente Poli notified of pt's admission and status. Aware of elevated B/P, sve, Bradley method and difficulty monitoring pt due to pt's movement. WIll admit to [facility]

## 2013-03-28 LAB — CBC
Hemoglobin: 10.6 g/dL — ABNORMAL LOW (ref 12.0–15.0)
MCH: 31.9 pg (ref 26.0–34.0)
MCV: 91.9 fL (ref 78.0–100.0)
RBC: 3.32 MIL/uL — ABNORMAL LOW (ref 3.87–5.11)
RDW: 13.5 % (ref 11.5–15.5)
WBC: 16.2 10*3/uL — ABNORMAL HIGH (ref 4.0–10.5)

## 2013-03-28 NOTE — Progress Notes (Signed)
Post Partum Day 1 Subjective: no complaints, up ad lib and voiding  Objective: Blood pressure 127/78, pulse 78, temperature 97.1 F (36.2 C), temperature source Oral, resp. rate 18, height 5\' 7"  (1.702 m), weight 102.059 kg (225 lb), SpO2 99.00%, unknown if currently breastfeeding.  Physical Exam:  General: alert, cooperative and appears stated age Lochia: appropriate Uterine Fundus: firm Incision: healing well, no significant drainage DVT Evaluation: No evidence of DVT seen on physical exam.   Recent Labs  03/27/13 0840 03/28/13 0550  HGB 14.2 10.6*  HCT 39.1 30.5*    Assessment/Plan: Plan for discharge tomorrow and Breastfeeding   LOS: 1 day   Dejaun Vidrio L 03/28/2013, 8:11 AM

## 2013-03-28 NOTE — Anesthesia Postprocedure Evaluation (Signed)
  Anesthesia Post-op Note  Patient: [name]  Procedure(s) Performed: * No procedures listed *  Patient Location: Mother/Baby  Anesthesia Type:Epidural  Level of Consciousness: awake and alert   Airway and Oxygen Therapy: Patient Spontanous Breathing  Post-op Pain: mild  Post-op Assessment: Patient's Cardiovascular Status Stable, Respiratory Function Stable, No signs of Nausea or vomiting, Pain level controlled, No headache, No residual numbness and No residual motor weakness  Post-op Vital Signs: stable  Complications: No apparent anesthesia complications

## 2013-03-29 LAB — COMPREHENSIVE METABOLIC PANEL
Albumin: 2.2 g/dL — ABNORMAL LOW (ref 3.5–5.2)
Alkaline Phosphatase: 82 U/L (ref 39–117)
BUN: 16 mg/dL (ref 6–23)
CO2: 23 mEq/L (ref 19–32)
Chloride: 105 mEq/L (ref 96–112)
Creatinine, Ser: 0.81 mg/dL (ref 0.50–1.10)
GFR calc Af Amer: >90 mL/min (ref >90)
GFR calc non Af Amer: 88 mL/min — ABNORMAL LOW (ref >90)
Glucose, Bld: 85 mg/dL (ref 70–99)
Total Bilirubin: 0.1 mg/dL — ABNORMAL LOW (ref 0.3–1.2)

## 2013-03-29 LAB — CBC
HCT: 29.3 % — ABNORMAL LOW (ref 36.0–46.0)
Hemoglobin: 10.2 g/dL — ABNORMAL LOW (ref 12.0–15.0)
MCV: 93 fL (ref 78.0–100.0)
RBC: 3.15 MIL/uL — ABNORMAL LOW (ref 3.87–5.11)
RDW: 14.1 % (ref 11.5–15.5)
WBC: 13 10*3/uL — ABNORMAL HIGH (ref 4.0–10.5)

## 2013-03-29 MED ORDER — IBUPROFEN 600 MG PO TABS: 600.0000 mg | ORAL_TABLET | Freq: Four times a day (QID) | ORAL | Status: AC

## 2013-03-29 NOTE — Progress Notes (Signed)
Post Partum Day 2 Subjective: no complaints, up ad lib, voiding, tolerating PO and denies HA, blurred vision or RUQ pain. BP was noted to be elevated last week of pregnancy, no meds required  Objective: Blood pressure 137/83, pulse 74, temperature 97.7 F (36.5 C), temperature source Oral, resp. rate 18, height 5\' 7"  (1.702 m), weight 225 lb (102.059 kg), SpO2 99.00%, unknown if currently breastfeeding.  Physical Exam:  General: alert and cooperative Lochia: appropriate Uterine Fundus: firm Incision: perineum intact DVT Evaluation: No evidence of DVT seen on physical exam. Negative Homan's sign. No cords or calf tenderness. Calf/Ankle edema is present. DTR's 1+ no clonus   Recent Labs  03/27/13 0840 03/28/13 0550  HGB 14.2 10.6*  HCT 39.1 30.5*    Assessment/Plan: Discharge home if PIH WNL. Reviewed sign and symptoms of PIH, patient instructed to RTO later this week for bp check even if labs are normal.   LOS: 2 days   CURTIS,CAROL G 03/29/2013, 8:33 AM

## 2013-03-29 NOTE — Lactation Note (Signed)
This note was copied from the chart of Karen Camarie Mctigue. Lactation Consultation Note: Follow up visit with mom before DC. Waiting for bili check before baby's DC, No stool or void since 6 pm. Baby asleep skin to skin with dad. Assisted mom with latch in football position. Mom complains of sore nipples. Left nipple with positional stripe. Has comfort gels. Assisted mom with pillow placement and rolled blanket under her hand and mom states that feels better. Baby nursed well with swallows noted. Asking about when to introduce pumping and bottles.Discussed options with parents. No further questions at present. Reviewed BFSG and OP appointments as resources for support after DC. They will see LC at [facility]. To call prn  Patient Name: [name] [id number] Date: 03/29/2013 Reason for consult: Follow-up assessment   Maternal Data    Feeding Feeding Type: Breast Fed Length of feed: 25 min  LATCH Score/Interventions Latch: Grasps breast easily, tongue down, lips flanged, rhythmical sucking. Intervention(s): Skin to skin  Audible Swallowing: A few with stimulation  Type of Nipple: Everted at rest and after stimulation  Comfort (Breast/Nipple): Filling, red/small blisters or bruises, mild/mod discomfort  Problem noted: Mild/Moderate discomfort Interventions (Mild/moderate discomfort): Comfort gels  Hold (Positioning): Assistance needed to correctly position infant at breast and maintain latch. Intervention(s): Breastfeeding basics reviewed;Support Pillows  LATCH Score: 7  Lactation Tools Discussed/Used     Consult Status Consult Status: Complete    Pamelia Hoit 03/29/2013, 12:50 PM

## 2013-03-29 NOTE — Discharge Summary (Signed)
Obstetric Discharge Summary Reason for Admission: rupture of membranes Prenatal Procedures: ultrasound Intrapartum Procedures: vacuum Postpartum Procedures: ML episiotomy Complications-Operative and Postpartum: none Hemoglobin  Date Value Range Status  03/28/2013 10.6* 12.0 - 15.0 g/dL Final     REPEATED TO VERIFY     DELTA CHECK NOTED     HCT  Date Value Range Status  03/28/2013 30.5* 36.0 - 46.0 % Final    Physical Exam:  General: alert, cooperative, fatigued and denies HA, blurred vision and RUQ pain Lochia: appropriate Uterine Fundus: firm Incision: healing well DVT Evaluation: No evidence of DVT seen on physical exam. Negative Homan's sign. No cords or calf tenderness. Calf/Ankle edema is present. DTR's 1+ no clonus  Discharge Diagnoses: Term Pregnancy-delivered  Discharge Information: Date: 03/29/2013 Activity: pelvic rest Diet: routine Medications: PNV and Ibuprofen Condition: stable Instructions: refer to practice specific booklet Discharge to: home   Newborn Data: Live born female  Birth Weight: 6 lb 10 oz (3005 g) APGAR: 8, 9  Home with mother.  CURTIS,CAROL G 03/29/2013, 8:38 AM

## 2014-02-21 ENCOUNTER — Encounter (HOSPITAL_COMMUNITY): Payer: Self-pay | Admitting: *Deleted

## 2017-10-13 ENCOUNTER — Other Ambulatory Visit: Payer: Self-pay | Admitting: Obstetrics and Gynecology

## 2017-10-13 DIAGNOSIS — R928 Other abnormal and inconclusive findings on diagnostic imaging of breast: Secondary | ICD-10-CM

## 2017-10-21 ENCOUNTER — Other Ambulatory Visit: Payer: Self-pay | Admitting: Obstetrics and Gynecology

## 2017-10-21 ENCOUNTER — Ambulatory Visit
Admission: RE | Admit: 2017-10-21 | Discharge: 2017-10-21 | Disposition: A | Discharge status: Home / Self Care | Payer: BLUE CROSS/BLUE SHIELD | Source: Ambulatory Visit | Attending: Obstetrics and Gynecology | Admitting: Obstetrics and Gynecology

## 2017-10-21 DIAGNOSIS — R928 Other abnormal and inconclusive findings on diagnostic imaging of breast: Secondary | ICD-10-CM

## 2017-10-21 DIAGNOSIS — R921 Mammographic calcification found on diagnostic imaging of breast: Secondary | ICD-10-CM

## 2018-04-27 ENCOUNTER — Other Ambulatory Visit: Payer: Self-pay | Admitting: Obstetrics and Gynecology

## 2018-04-27 ENCOUNTER — Ambulatory Visit
Admission: RE | Admit: 2018-04-27 | Discharge: 2018-04-27 | Disposition: A | Discharge status: Home / Self Care | Payer: BLUE CROSS/BLUE SHIELD | Source: Ambulatory Visit | Attending: Obstetrics and Gynecology | Admitting: Obstetrics and Gynecology

## 2018-04-27 DIAGNOSIS — R921 Mammographic calcification found on diagnostic imaging of breast: Secondary | ICD-10-CM

## 2018-09-21 ENCOUNTER — Other Ambulatory Visit: Payer: Self-pay | Admitting: Obstetrics and Gynecology

## 2018-09-21 DIAGNOSIS — R921 Mammographic calcification found on diagnostic imaging of breast: Secondary | ICD-10-CM

## 2018-09-21 DIAGNOSIS — N644 Mastodynia: Secondary | ICD-10-CM

## 2018-09-28 ENCOUNTER — Ambulatory Visit
Admission: RE | Admit: 2018-09-28 | Discharge: 2018-09-28 | Disposition: A | Discharge status: Home / Self Care | Payer: BLUE CROSS/BLUE SHIELD | Source: Ambulatory Visit | Attending: Obstetrics and Gynecology | Admitting: Obstetrics and Gynecology

## 2018-09-28 ENCOUNTER — Other Ambulatory Visit: Payer: Self-pay | Admitting: Obstetrics and Gynecology

## 2018-09-28 ENCOUNTER — Other Ambulatory Visit: Payer: Self-pay

## 2018-09-28 ENCOUNTER — Ambulatory Visit
Admission: RE | Admit: 2018-09-28 | Discharge: 2018-09-28 | Disposition: A | Discharge status: Home / Self Care | Payer: BC Managed Care – PPO | Source: Ambulatory Visit | Attending: Obstetrics and Gynecology | Admitting: Obstetrics and Gynecology

## 2018-09-28 DIAGNOSIS — R921 Mammographic calcification found on diagnostic imaging of breast: Secondary | ICD-10-CM

## 2018-09-28 DIAGNOSIS — N644 Mastodynia: Secondary | ICD-10-CM

## 2018-10-12 ENCOUNTER — Other Ambulatory Visit: Payer: BLUE CROSS/BLUE SHIELD

## 2019-09-16 ENCOUNTER — Other Ambulatory Visit: Payer: Self-pay | Admitting: Obstetrics and Gynecology

## 2019-09-16 DIAGNOSIS — R921 Mammographic calcification found on diagnostic imaging of breast: Secondary | ICD-10-CM

## 2019-10-05 ENCOUNTER — Ambulatory Visit
Admission: RE | Admit: 2019-10-05 | Discharge: 2019-10-05 | Disposition: A | Discharge status: Home / Self Care | Payer: BC Managed Care – PPO | Source: Ambulatory Visit | Attending: Obstetrics and Gynecology | Admitting: Obstetrics and Gynecology

## 2019-10-05 ENCOUNTER — Other Ambulatory Visit: Payer: Self-pay

## 2019-10-05 DIAGNOSIS — R921 Mammographic calcification found on diagnostic imaging of breast: Secondary | ICD-10-CM

## 2020-03-24 ENCOUNTER — Telehealth: Payer: Self-pay

## 2020-03-24 NOTE — Telephone Encounter (Signed)
NOTES ON FILE FROM DR Dian Queen MD [phone], SENT REFERRAL TO [facility]

## 2020-04-19 ENCOUNTER — Encounter: Payer: Self-pay | Admitting: Gastroenterology

## 2020-05-05 ENCOUNTER — Ambulatory Visit (AMBULATORY_SURGERY_CENTER): Payer: Self-pay

## 2020-05-05 ENCOUNTER — Other Ambulatory Visit: Payer: Self-pay

## 2020-05-05 VITALS — Ht 66.5 in | Wt 230.0 lb

## 2020-05-05 DIAGNOSIS — Z1211 Encounter for screening for malignant neoplasm of colon: Secondary | ICD-10-CM

## 2020-05-05 MED ORDER — SUTAB 1479-225-188 MG PO TABS: 1.0000 | ORAL_TABLET | ORAL | 0 refills | Status: DC

## 2020-05-05 NOTE — Progress Notes (Signed)
Pre visit completed via phone; Patient verified name, DOB, and address; No egg or soy allergy known to patient  No issues with past sedation with any surgeries or procedures No intubation problems in the past  No FH of Malignant Hyperthermia No diet pills per patient No home 02 use per patient  No blood thinners per patient  Pt denies issues with constipation  No A fib or A flutter  EMMI video via [place] 19 guidelines implemented in PV today with Pt and RN  Pt is fully vaccinated  for Covid  Coupon given to pt in PV today , Code to Pharmacy  Due to the COVID-19 pandemic we are asking patients to follow certain guidelines.  Pt aware of COVID protocols and [facility] guidelines

## 2020-05-19 ENCOUNTER — Encounter: Payer: BC Managed Care – PPO | Admitting: Gastroenterology

## 2020-05-23 ENCOUNTER — Ambulatory Visit (INDEPENDENT_AMBULATORY_CARE_PROVIDER_SITE_OTHER): Payer: BC Managed Care – PPO

## 2020-05-23 ENCOUNTER — Encounter: Payer: Self-pay | Admitting: Cardiovascular Disease

## 2020-05-23 ENCOUNTER — Other Ambulatory Visit: Payer: Self-pay | Admitting: Cardiovascular Disease

## 2020-05-23 ENCOUNTER — Ambulatory Visit (INDEPENDENT_AMBULATORY_CARE_PROVIDER_SITE_OTHER): Payer: BC Managed Care – PPO | Admitting: Cardiovascular Disease

## 2020-05-23 ENCOUNTER — Encounter: Payer: Self-pay | Admitting: *Deleted

## 2020-05-23 ENCOUNTER — Other Ambulatory Visit: Payer: Self-pay

## 2020-05-23 VITALS — BP 128/72 | Ht 66.5 in | Wt 229.0 lb

## 2020-05-23 DIAGNOSIS — Z8249 Family history of ischemic heart disease and other diseases of the circulatory system: Secondary | ICD-10-CM

## 2020-05-23 DIAGNOSIS — Z8241 Family history of sudden cardiac death: Secondary | ICD-10-CM

## 2020-05-23 DIAGNOSIS — I498 Other specified cardiac arrhythmias: Secondary | ICD-10-CM

## 2020-05-23 DIAGNOSIS — Z0000 Encounter for general adult medical examination without abnormal findings: Secondary | ICD-10-CM

## 2020-05-23 NOTE — Assessment & Plan Note (Signed)
Ms. [name] was referred to me by Dr. Helane Rima , her OB/GYN, for evaluation of potential heart disease in the setting of family history of heart disease.  Her brother who is 51 years old apparently had sudden cardiac death [place] the [place] at age 63 and was resuscitated by his daughter on the golf course.  He had hypothermia treatment and ultimately a defibrillator placed with complete neurologic recovery.  Apparently her paternal grandfather died in his late 72s of dilated cardiomyopathy.  She is very active and completely asymptomatic.  I am going get a 2D echo, coronary calcium score and a 2-week Zio patch to further evaluate.  If the echo is abnormal anyway she may need further genetic testing for dilated cardiomyopathy.

## 2020-05-23 NOTE — Progress Notes (Signed)
05/23/2020 [name]   Dec 17, 1970  [id number]  Primary Physician Fanny Bien, MD Primary Cardiologist: Lorretta Harp MD [name], [place]  HPI:  [name] is a 50 y.o. moderately overweight divorced Caucasian female mother of one 8-year-old daughter referred to me by Dr. Helane Rima , her OB/GYN, because of family history of heart disease.  She has no other cardiac risk factors.  Apparently her paternal grandfather died in his late 84s due to dilated cardiomyopathy.  Her brother who is 75 years old had a witnessed cardiac arrest [place] the [place] and was resuscitated by his teenage daughter.  He had hypothermic therapy and ultimately recovered full neurologic function and had an ICD placed.   Current Meds  Medication Sig  . vitamin E 180 MG (400 UNITS) capsule Take 400 Units by mouth daily.     No Known Allergies  Social History   Socioeconomic History  . Marital status: Married    Spouse name: Not on file  . Number of children: Not on file  . Years of education: Not on file  . Highest education level: Not on file  Occupational History  . Not on file  Tobacco Use  . Smoking status: Never Smoker  . Smokeless tobacco: Never Used  Vaping Use  . Vaping Use: Never used  Substance and Sexual Activity  . Alcohol use: Yes    Alcohol/week: 5.0 standard drinks    Types: 5 Standard drinks or equivalent per week  . Drug use: No  . Sexual activity: Yes    Comment: trying to get pregnant  Other Topics Concern  . Not on file  Social History Narrative  . Not on file   Social Determinants of Health   Financial Resource Strain: Not on file  Food Insecurity: Not on file  Transportation Needs: Not on file  Physical Activity: Not on file  Stress: Not on file  Social Connections: Not on file  Intimate Partner Violence: Not on file     Review of Systems: General: negative for chills, fever, night sweats or weight changes.  Cardiovascular: negative  for chest pain, dyspnea on exertion, edema, orthopnea, palpitations, paroxysmal nocturnal dyspnea or shortness of breath Dermatological: negative for rash Respiratory: negative for cough or wheezing Urologic: negative for hematuria Abdominal: negative for nausea, vomiting, diarrhea, bright red blood per rectum, melena, or hematemesis Neurologic: negative for visual changes, syncope, or dizziness All other systems reviewed and are otherwise negative except as noted above.    Blood pressure 128/72, height 5' 6.5" (1.689 m), weight 229 lb (103.9 kg), unknown if currently breastfeeding.  General appearance: alert and no distress Neck: no adenopathy, no carotid bruit, no JVD, supple, symmetrical, trachea midline and thyroid not enlarged, symmetric, no tenderness/mass/nodules Lungs: clear to auscultation bilaterally Heart: regular rate and rhythm, S1, S2 normal, no murmur, click, rub or gallop Extremities: extremities normal, atraumatic, no cyanosis or edema Pulses: 2+ and symmetric Skin: Skin color, texture, turgor normal. No rashes or lesions Neurologic: Alert and oriented X 3, normal strength and tone. Normal symmetric reflexes. Normal coordination and gait  EKG sinus rhythm at 76 without ST or T wave changes.  I personally reviewed this EKG.  ASSESSMENT AND PLAN:   Family history of heart disease Ms. Jolaine Artist was referred to me by Dr. Helane Rima , her OB/GYN, for evaluation of potential heart disease in the setting of family history of heart disease.  Her brother who is 78 years old apparently  had sudden cardiac death [place] the [place] at age 36 and was resuscitated by his daughter on the golf course.  He had hypothermia treatment and ultimately a defibrillator placed with complete neurologic recovery.  Apparently her paternal grandfather died in his late 75s of dilated cardiomyopathy.  She is very active and completely asymptomatic.  I am going get a 2D echo, coronary calcium score and a 2-week  Zio patch to further evaluate.  If the echo is abnormal anyway she may need further genetic testing for dilated cardiomyopathy.      Lorretta Harp MD FACP,FACC,FAHA, [facility] 05/23/2020 10:07 AM

## 2020-05-23 NOTE — Patient Instructions (Signed)
Medication Instructions:  Your physician recommends that you continue on your current medications as directed. Please refer to the Current Medication list given to you today.  *If you need a refill on your cardiac medications before your next appointment, please call your pharmacy*   Testing/Procedures: Your physician has requested that you have an echocardiogram. Echocardiography is a painless test that uses sound waves to create images of your heart. It provides your doctor with information about the size and shape of your heart and how well your heart's chambers and valves are working. This procedure takes approximately one hour. There are no restrictions for this procedure. This procedure is done at [address] Monitor Instructions   Your physician has requested you wear your ZIO patch monitor 14 days.   This is a single patch monitor.  [employer] supplies one patch monitor per enrollment.  Additional stickers are not available.   Please do not apply patch if you will be having a Nuclear Stress Test, Echocardiogram, Cardiac CT, MRI, or Chest Xray during the time frame you would be wearing the monitor. The patch cannot be worn during these tests.  You cannot remove and re-apply the ZIO XT patch monitor.   Your ZIO patch monitor will be sent USPS Priority mail from [facility] directly to your home address. The monitor may also be mailed to a PO BOX if home delivery is not available.   It may take 3-5 days to receive your monitor after you have been enrolled.   Once you have received you monitor, please review enclosed instructions.  Your monitor has already been registered assigning a specific monitor serial # to you.   Applying the monitor   Shave hair from upper left chest.   Hold abrader disc by orange tab.  Rub abrader in 40 strokes over left upper chest as indicated in your monitor instructions.   Clean area with 4 enclosed alcohol pads .   Use all pads to assure are is cleaned thoroughly.  Let dry.   Apply patch as indicated in monitor instructions.  Patch will be place under collarbone on left side of chest with arrow pointing upward.   Rub patch adhesive wings for 2 minutes.Remove white label marked "1".  Remove white label marked "2".  Rub patch adhesive wings for 2 additional minutes.   While looking in a mirror, press and release button in center of patch.  A small green light will flash 3-4 times .  This will be your only indicator the monitor has been turned on.     Do not shower for the first 24 hours.  You may shower after the first 24 hours.   Press button if you feel a symptom. You will hear a small click.  Record Date, Time and Symptom in the Patient Log Book.   When you are ready to remove patch, follow instructions on last 2 pages of Patient Log Book.  Stick patch monitor onto last page of Patient Log Book.   Place Patient Log Book in [employer] box.  Use locking tab on box and tape box closed securely.  The [employer] and [employer] has [employer] on it.  Please place in mailbox as soon as possible.  Your physician should have your test results approximately 7 days after the monitor has been mailed back to [facility].   Call [place] at [phone] if you have questions regarding your ZIO XT  patch monitor.  Call them immediately if you see an orange light blinking on your monitor.   If your monitor falls off in less than 4 days contact our Monitor department at [phone].  If your monitor becomes loose or falls off after 4 days call [employer] at [phone] for suggestions on securing your monitor.    Dr. Gwenlyn Found has ordered a CT coronary calcium score. This test is done at [address]. This is $99 out of pocket.   Coronary CalciumScan A coronary calcium scan is an imaging test used to look for deposits of calcium and other fatty materials (plaques) in the inner lining  of the blood vessels of the heart (coronary arteries). These deposits of calcium and plaques can partly clog and narrow the coronary arteries without producing any symptoms or warning signs. This puts a person at risk for a heart attack. This test can detect these deposits before symptoms develop. Tell a health care provider about:  Any allergies you have.  All medicines you are taking, including vitamins, herbs, eye drops, creams, and over-the-counter medicines.  Any problems you or family members have had with anesthetic medicines.  Any blood disorders you have.  Any surgeries you have had.  Any medical conditions you have.  Whether you are pregnant or may be pregnant. What are the risks? Generally, this is a safe procedure. However, problems may occur, including:  Harm to a pregnant woman and her unborn baby. This test involves the use of radiation. Radiation exposure can be dangerous to a pregnant woman and her unborn baby. If you are pregnant, you generally should not have this procedure done.  Slight increase in the risk of cancer. This is because of the radiation involved in the test. What happens before the procedure? No preparation is needed for this procedure. What happens during the procedure?  You will undress and remove any jewelry around your neck or chest.  You will put on a hospital gown.  Sticky electrodes will be placed on your chest. The electrodes will be connected to an electrocardiogram (ECG) machine to record a tracing of the electrical activity of your heart.  A CT scanner will take pictures of your heart. During this time, you will be asked to lie still and hold your breath for 2-3 seconds while a picture of your heart is being taken. The procedure may vary among health care providers and hospitals. What happens after the procedure?  You can get dressed.  You can return to your normal activities.  It is up to you to get the results of your test. Ask  your health care provider, or the department that is doing the test, when your results will be ready. Summary  A coronary calcium scan is an imaging test used to look for deposits of calcium and other fatty materials (plaques) in the inner lining of the blood vessels of the heart (coronary arteries).  Generally, this is a safe procedure. Tell your health care provider if you are pregnant or may be pregnant.  No preparation is needed for this procedure.  A CT scanner will take pictures of your heart.  You can return to your normal activities after the scan is done. This information is not intended to replace advice given to you by your health care provider. Make sure you discuss any questions you have with your health care provider. Document Released: 10/05/2007 Document Revised: 02/26/2016 Document Reviewed: 02/26/2016 Elsevier Interactive Patient Education  2017 [employer].  Follow-Up: At [facility], you and your health needs are our priority.  As part of our continuing mission to provide you with exceptional heart care, we have created designated Provider Care Teams.  These Care Teams include your primary Cardiologist (physician) and Advanced Practice Providers (APPs -  Physician Assistants and Nurse Practitioners) who all work together to provide you with the care you need, when you need it.  We recommend signing up for the patient portal called "MyChart".  Sign up information is provided on this After Visit Summary.  MyChart is used to connect with patients for Virtual Visits (Telemedicine).  Patients are able to view lab/test results, encounter notes, upcoming appointments, etc.  Non-urgent messages can be sent to your provider as well.   To learn more about what you can do with MyChart, go to [email or web address].    Your next appointment:   6 month(s)  The format for your next appointment:   In Person  Provider:   Quay Burow, MD

## 2020-05-23 NOTE — Progress Notes (Signed)
Patient ID: [name], female   DOB: April 02, 1971, 50 y.o.   MRN: [id number] Patient enrolled for [employer] to ship a 14 day ZIO XT long term holter monitor to her home.

## 2020-06-13 ENCOUNTER — Ambulatory Visit (HOSPITAL_COMMUNITY): Payer: BC Managed Care – PPO | Attending: Internal Medicine

## 2020-06-13 ENCOUNTER — Other Ambulatory Visit: Payer: Self-pay

## 2020-06-13 ENCOUNTER — Ambulatory Visit (INDEPENDENT_AMBULATORY_CARE_PROVIDER_SITE_OTHER)
Admission: RE | Admit: 2020-06-13 | Discharge: 2020-06-13 | Disposition: A | Discharge status: Home / Self Care | Payer: Self-pay | Source: Ambulatory Visit | Attending: Cardiovascular Disease | Admitting: Cardiovascular Disease

## 2020-06-13 DIAGNOSIS — Z8249 Family history of ischemic heart disease and other diseases of the circulatory system: Secondary | ICD-10-CM

## 2020-06-13 LAB — ECHOCARDIOGRAM COMPLETE
Area-P 1/2: 5.02 cm2
S' Lateral: 2.9 cm

## 2020-06-23 ENCOUNTER — Encounter: Payer: BC Managed Care – PPO | Admitting: Gastroenterology

## 2020-06-26 ENCOUNTER — Encounter: Payer: Self-pay | Admitting: Gastroenterology

## 2020-07-07 ENCOUNTER — Encounter: Payer: BC Managed Care – PPO | Admitting: Gastroenterology

## 2020-08-22 ENCOUNTER — Encounter: Payer: BC Managed Care – PPO | Admitting: Gastroenterology

## 2020-10-11 ENCOUNTER — Encounter: Payer: BC Managed Care – PPO | Admitting: Gastroenterology

## 2020-10-12 ENCOUNTER — Telehealth: Payer: Self-pay | Admitting: Gastroenterology

## 2020-10-12 NOTE — Telephone Encounter (Signed)
Recv'd records from Dr. Rachell Cipro forwarded 6 pages to Dr. Deatra Ina. 6/23/22fbg

## 2020-11-07 ENCOUNTER — Other Ambulatory Visit: Payer: Self-pay | Admitting: Obstetrics and Gynecology

## 2020-11-07 DIAGNOSIS — R921 Mammographic calcification found on diagnostic imaging of breast: Secondary | ICD-10-CM

## 2020-11-28 ENCOUNTER — Ambulatory Visit
Admission: RE | Admit: 2020-11-28 | Discharge: 2020-11-28 | Disposition: A | Discharge status: Home / Self Care | Payer: BC Managed Care – PPO | Source: Ambulatory Visit | Attending: Obstetrics and Gynecology | Admitting: Obstetrics and Gynecology

## 2020-11-28 ENCOUNTER — Other Ambulatory Visit: Payer: Self-pay

## 2020-11-28 DIAGNOSIS — R921 Mammographic calcification found on diagnostic imaging of breast: Secondary | ICD-10-CM

## 2020-12-08 ENCOUNTER — Encounter: Payer: BC Managed Care – PPO | Admitting: Gastroenterology

## 2021-07-02 ENCOUNTER — Encounter: Payer: Self-pay | Admitting: Gastroenterology

## 2021-07-31 ENCOUNTER — Ambulatory Visit (AMBULATORY_SURGERY_CENTER): Payer: BC Managed Care – PPO

## 2021-07-31 VITALS — Ht 66.5 in | Wt 215.0 lb

## 2021-07-31 DIAGNOSIS — Z1211 Encounter for screening for malignant neoplasm of colon: Secondary | ICD-10-CM

## 2021-07-31 NOTE — Progress Notes (Signed)
No egg or soy allergy known to patient  ?No issues known to pt with past sedation with any surgeries or procedures ?Patient denies ever being told they had issues or difficulty with intubation  ?No FH of Malignant Hyperthermia ?Pt is not on diet pills ?Pt is not on  home 02  ?Pt is not on blood thinners  ?Pt denies issues with constipation  ?No A fib or A flutter ?Patient reports got she already has the [place] Rx at home from last PV visit appt; ?Due to the COVID-19 pandemic we are asking patients to follow certain guidelines in PV and the [place]   ?Pt aware of COVID protocols and [facility] guidelines  ?PV completed over the phone. Pt verified name, DOB, address and insurance during PV today.  ?Pt mailed instruction packet with copy of consent form to read and not return, and instructions.  ?Pt encouraged to call with questions or issues.  ?If pt has My chart, procedure instructions sent via My Chart  ? ?

## 2021-08-10 ENCOUNTER — Encounter: Payer: Self-pay | Admitting: Gastroenterology

## 2021-08-21 ENCOUNTER — Ambulatory Visit (AMBULATORY_SURGERY_CENTER): Payer: BC Managed Care – PPO | Admitting: Gastroenterology

## 2021-08-21 ENCOUNTER — Encounter: Payer: Self-pay | Admitting: Gastroenterology

## 2021-08-21 VITALS — BP 111/59 | HR 64 | Temp 96.2°F | Resp 15 | Ht 66.5 in | Wt 215.0 lb

## 2021-08-21 DIAGNOSIS — D125 Benign neoplasm of sigmoid colon: Secondary | ICD-10-CM | POA: Diagnosis not present

## 2021-08-21 DIAGNOSIS — Z1211 Encounter for screening for malignant neoplasm of colon: Secondary | ICD-10-CM | POA: Diagnosis not present

## 2021-08-21 DIAGNOSIS — K635 Polyp of colon: Secondary | ICD-10-CM | POA: Diagnosis not present

## 2021-08-21 MED ORDER — SODIUM CHLORIDE 0.9 % IV SOLN: 500.0000 mL | INTRAVENOUS | Status: DC

## 2021-08-21 NOTE — Progress Notes (Signed)
Called to room to assist during endoscopic procedure.  Patient ID and intended procedure confirmed with present staff. Received instructions for my participation in the procedure from the performing physician.  

## 2021-08-21 NOTE — Progress Notes (Signed)
Report to pacu rn; vss. ?

## 2021-08-21 NOTE — Op Note (Signed)
[place] ?Patient Name: [name] ?Procedure Date: 08/21/2021 1:36 PM ?MRN: [id number] ?Endoscopist: Mauri Pole , MD ?Age: 51 ?Referring MD:  ?Date of Birth: 01-15-1971 ?Gender: Female ?Account #: [id number] ?Procedure:                Colonoscopy ?Indications:              Screening for colorectal malignant neoplasm ?Medicines:                Monitored Anesthesia Care ?Procedure:                Pre-Anesthesia Assessment: ?                          - Prior to the procedure, a History and Physical  ?                          was performed, and patient medications and  ?                          allergies were reviewed. The patient's tolerance of  ?                          previous anesthesia was also reviewed. The risks  ?                          and benefits of the procedure and the sedation  ?                          options and risks were discussed with the patient.  ?                          All questions were answered, and informed consent  ?                          was obtained. Prior Anticoagulants: The patient has  ?                          taken no previous anticoagulant or antiplatelet  ?                          agents. ASA Grade Assessment: II - A patient with  ?                          mild systemic disease. After reviewing the risks  ?                          and benefits, the patient was deemed in  ?                          satisfactory condition to undergo the procedure. ?                          After obtaining informed consent, the colonoscope  ?  was passed under direct vision. Throughout the  ?                          procedure, the patient's blood pressure, pulse, and  ?                          oxygen saturations were monitored continuously. The  ?                          Olympus PCF-[id number] (#[id number]) Colonoscope was  ?                          introduced through the anus and advanced to the the  ?                          cecum,  identified by appendiceal orifice and  ?                          ileocecal valve. The colonoscopy was performed  ?                          without difficulty. The patient tolerated the  ?                          procedure well. The quality of the bowel  ?                          preparation was good. The ileocecal valve,  ?                          appendiceal orifice, and rectum were photographed. ?Scope In: 1:40:08 PM ?Scope Out: 1:59:01 PM ?Scope Withdrawal Time: 0 hours 13 minutes 28 seconds  ?Total Procedure Duration: 0 hours 18 minutes 53 seconds  ?Findings:                 The perianal and digital rectal examinations were  ?                          normal. ?                          A 4 mm polyp was found in the sigmoid colon. The  ?                          polyp was sessile. The polyp was removed with a  ?                          cold snare. Resection and retrieval were complete. ?                          Non-bleeding external and internal hemorrhoids were  ?                          found during retroflexion. The hemorrhoids were  ?  medium-sized. ?                          The exam was otherwise without abnormality. ?Complications:            No immediate complications. ?Estimated Blood Loss:     Estimated blood loss was minimal. ?Impression:               - One 4 mm polyp in the sigmoid colon, removed with  ?                          a cold snare. Resected and retrieved. ?                          - Non-bleeding external and internal hemorrhoids. ?                          - The examination was otherwise normal. ?Recommendation:           - Patient has a contact number available for  ?                          emergencies. The signs and symptoms of potential  ?                          delayed complications were discussed with the  ?                          patient. Return to normal activities tomorrow.  ?                          Written discharge instructions were  provided to the  ?                          patient. ?                          - Resume previous diet. ?                          - Continue present medications. ?                          - Await pathology results. ?                          - Repeat colonoscopy in 5-10 years for surveillance  ?                          based on pathology results. ?Mauri Pole, MD ?08/21/2021 2:02:43 PM ?This report has been signed electronically. ?

## 2021-08-21 NOTE — Progress Notes (Signed)
[facility] History and Physical ? ? ?Primary Care Physician:  Fanny Bien, MD ? ? ?Reason for Procedure:  Colorectal cancer screening ? ?Plan:    Screening colonoscopy with possible interventions as needed ? ? ? ? ?HPI: [name] is a very pleasant 51 y.o. female here for screening colonoscopy. ?Denies any nausea, vomiting, abdominal pain, melena or bright red blood per rectum ? ?The risks and benefits as well as alternatives of endoscopic procedure(s) have been discussed and reviewed. All questions answered. The patient agrees to proceed. ? ? ? ?Past Medical History:  ?Diagnosis Date  ? Anxiety   ? situational - on meds  ? Arthritis   ? Depression   ? situational - on meds  ? Hyperlipidemia   ? on meds  ? Osteoarthritis   ? Pregnancy induced hypertension   ? ? ?Past Surgical History:  ?Procedure Laterality Date  ? BACK SURGERY    ? EYE SURGERY  2001  ? Lasik surgery   ? FRACTURE SURGERY    ? HARDWARE REMOVAL  09/06/2011  ? Procedure: HARDWARE REMOVAL;  Surgeon: Augustin Schooling, MD;  Location: [facility];  Service: Orthopedics;  Laterality: Right;  Removal of right clavicle pin  ? LUMBAR FUSION  10-2009  ? ORIF CLAVICLE FRACTURE  07-2010  ? WISDOM TOOTH EXTRACTION    ? ? ?Prior to Admission medications   ?Medication Sig Start Date End Date Taking? Authorizing Provider  ?ALPRAZolam (XANAX) 0.5 MG tablet Take 0.5 mg by mouth as needed for anxiety.    [provider]  ?buPROPion (WELLBUTRIN XL) 150 MG 24 hr tablet Take 150 mg by mouth every morning. 05/10/20   [provider]  ?cyclobenzaprine (FLEXERIL) 5 MG tablet Take 1 tablet by mouth daily at 2 am. 06/05/21   [provider]  ?ibuprofen (ADVIL,MOTRIN) 600 MG tablet Take 1 tablet (600 mg total) by mouth every 6 (six) hours. 03/29/13   Juanda Chance, NP  ?metFORMIN (GLUCOPHAGE-XR) 500 MG 24 hr tablet Take 500 mg by mouth daily. 07/04/21   [provider]  ?Multiple Vitamin (MULITIVITAMIN WITH MINERALS) TABS Take  1 tablet by mouth daily.    [provider]  ?sertraline (ZOLOFT) 100 MG tablet Take by mouth daily.    [provider]  ?simvastatin (ZOCOR) 5 MG tablet Take 5 mg by mouth at bedtime. 07/04/21   [provider]  ?VITAMIN D, CHOLECALCIFEROL, PO Take 1 capsule by mouth daily.    [provider]  ? ? ?Current Outpatient Medications  ?Medication Sig Dispense Refill  ? ALPRAZolam (XANAX) 0.5 MG tablet Take 0.5 mg by mouth as needed for anxiety.    ? buPROPion (WELLBUTRIN XL) 150 MG 24 hr tablet Take 150 mg by mouth every morning.    ? cyclobenzaprine (FLEXERIL) 5 MG tablet Take 1 tablet by mouth daily at 2 am.    ? ibuprofen (ADVIL,MOTRIN) 600 MG tablet Take 1 tablet (600 mg total) by mouth every 6 (six) hours. 30 tablet 1  ? metFORMIN (GLUCOPHAGE-XR) 500 MG 24 hr tablet Take 500 mg by mouth daily.    ? Multiple Vitamin (MULITIVITAMIN WITH MINERALS) TABS Take 1 tablet by mouth daily.    ? sertraline (ZOLOFT) 100 MG tablet Take by mouth daily.    ? simvastatin (ZOCOR) 5 MG tablet Take 5 mg by mouth at bedtime.    ? VITAMIN D, CHOLECALCIFEROL, PO Take 1 capsule by mouth daily.    ? ?No current facility-administered medications for this  visit.  ? ? ?Allergies as of 08/21/2021  ? (No Known Allergies)  ? ? ?Family History  ?Problem Relation Age of Onset  ? Lung cancer Mother 5  ? Cancer Father 70  ?     peritoneal   ? Stomach cancer Father   ? Heart attack Brother 93  ? Anesthesia problems Neg Hx   ? Alcohol abuse Neg Hx   ? Colon polyps Neg Hx   ? Colon cancer Neg Hx   ? Esophageal cancer Neg Hx   ? Rectal cancer Neg Hx   ? ? ?Social History  ? ?Socioeconomic History  ? Marital status: Divorced  ?  Spouse name: Not on file  ? Number of children: Not on file  ? Years of education: Not on file  ? Highest education level: Not on file  ?Occupational History  ? Not on file  ?Tobacco Use  ? Smoking status: Never  ? Smokeless tobacco: Never  ?Vaping Use  ? Vaping Use: Never used  ?Substance and  Sexual Activity  ? Alcohol use: Yes  ?  Alcohol/week: 5.0 standard drinks  ?  Types: 5 Standard drinks or equivalent per week  ? Drug use: No  ? Sexual activity: Yes  ?  Comment: trying to get pregnant  ?Other Topics Concern  ? Not on file  ?Social History Narrative  ? Not on file  ? ?Social Determinants of Health  ? ?Financial Resource Strain: Not on file  ?Food Insecurity: Not on file  ?Transportation Needs: Not on file  ?Physical Activity: Not on file  ?Stress: Not on file  ?Social Connections: Not on file  ?Intimate Partner Violence: Not on file  ? ? ?Review of Systems: ? ?All other review of systems negative except as mentioned in the HPI. ? ?Physical Exam: ?Vital signs in last 24 hours: ?BP (!) 143/54   Pulse 70   Temp (!) 96.2 ?F (35.7 ?C) (Temporal)   Ht 5' 6.5" (1.689 m)   Wt 215 lb (97.5 kg)   SpO2 94%   BMI 34.18 kg/m?  ?General:   Alert, NAD ?Lungs:  Clear .   ?Heart:  Regular rate and rhythm ?Abdomen:  Soft, nontender and nondistended. ?Neuro/Psych:  Alert and cooperative. Normal mood and affect. A and O x 3 ? ?Reviewed labs, radiology imaging, old records and pertinent past GI work up ? ?Patient is appropriate for planned procedure(s) and anesthesia in an ambulatory setting ? ? ?K. Denzil Magnuson , MD ?[phone]  ? ? ?  ?

## 2021-08-21 NOTE — Progress Notes (Signed)
Pt's states no medical or surgical changes since previsit or office visit. 

## 2021-08-21 NOTE — Patient Instructions (Signed)
Handout on polyps given. ° °YOU HAD AN ENDOSCOPIC PROCEDURE TODAY AT THE [facility]:   Refer to the procedure report that was given to you for any specific questions about what was found during the examination.  If the procedure report does not answer your questions, please call your gastroenterologist to clarify.  If you requested that your care partner not be given the details of your procedure findings, then the procedure report has been included in a sealed envelope for you to review at your convenience later. ° °YOU SHOULD EXPECT: Some feelings of bloating in the abdomen. Passage of more gas than usual.  Walking can help get rid of the air that was put into your GI tract during the procedure and reduce the bloating. If you had a lower endoscopy (such as a colonoscopy or flexible sigmoidoscopy) you may notice spotting of blood in your stool or on the toilet paper. If you underwent a bowel prep for your procedure, you may not have a normal bowel movement for a few days. ° °Please Note:  You might notice some irritation and congestion in your nose or some drainage.  This is from the oxygen used during your procedure.  There is no need for concern and it should clear up in a day or so. ° °SYMPTOMS TO REPORT IMMEDIATELY: ° °Following lower endoscopy (colonoscopy or flexible sigmoidoscopy): ° Excessive amounts of blood in the stool ° Significant tenderness or worsening of abdominal pains ° Swelling of the abdomen that is new, acute ° Fever of 100°F or higher ° °For urgent or emergent issues, a gastroenterologist can be reached at any hour by calling [phone]. °Do not use MyChart messaging for urgent concerns.  ° ° °DIET:  We do recommend a small meal at first, but then you may proceed to your regular diet.  Drink plenty of fluids but you should avoid alcoholic beverages for 24 hours. ° °ACTIVITY:  You should plan to take it easy for the rest of today and you should NOT DRIVE or use heavy machinery  until tomorrow (because of the sedation medicines used during the test).   ° °FOLLOW UP: °Our staff will call the number listed on your records 48-72 hours following your procedure to check on you and address any questions or concerns that you may have regarding the information given to you following your procedure. If we do not reach you, we will leave a message.  We will attempt to reach you two times.  During this call, we will ask if you have developed any symptoms of COVID 19. If you develop any symptoms (ie: fever, flu-like symptoms, shortness of breath, cough etc.) before then, please call [phone].  If you test positive for Covid 19 in the 2 weeks post procedure, please call and report this information to us.   ° °If any biopsies were taken you will be contacted by phone or by letter within the next 1-3 weeks.  Please call us at [phone] if you have not heard about the biopsies in 3 weeks.  ° ° °SIGNATURES/CONFIDENTIALITY: °You and/or your care partner have signed paperwork which will be entered into your electronic medical record.  These signatures attest to the fact that that the information above on your After Visit Summary has been reviewed and is understood.  Full responsibility of the confidentiality of this discharge information lies with you and/or your care-partner.  °

## 2021-08-23 ENCOUNTER — Telehealth: Payer: Self-pay

## 2021-08-23 NOTE — Telephone Encounter (Signed)
Attempted to reach patient for post-procedure f/u call. No answer. Left message for her to please not hesitate to call us if she has any questions/concerns regarding her care. 

## 2021-08-23 NOTE — Telephone Encounter (Signed)
Attempted to reach patient for post-procedure f/u call. No answer. 2nd attempt. Left message. ?

## 2021-08-31 ENCOUNTER — Encounter: Payer: Self-pay | Admitting: Gastroenterology

## 2021-09-19 IMAGING — MG DIGITAL DIAGNOSTIC BILAT W/ TOMO W/ CAD
6 of 10 series · 6 of 26 positions shown · non-contrast
Comparison: 09/28/2018 and earlier

CLINICAL DATA: Two year follow-up for probably benign
calcifications in the LEFT breast. Patient is very anxious about
calcifications, given their location. Both her parents recently died
of lung cancer.

EXAM:
DIGITAL DIAGNOSTIC BILATERAL MAMMOGRAM WITH TOMO AND CAD

[L ML]
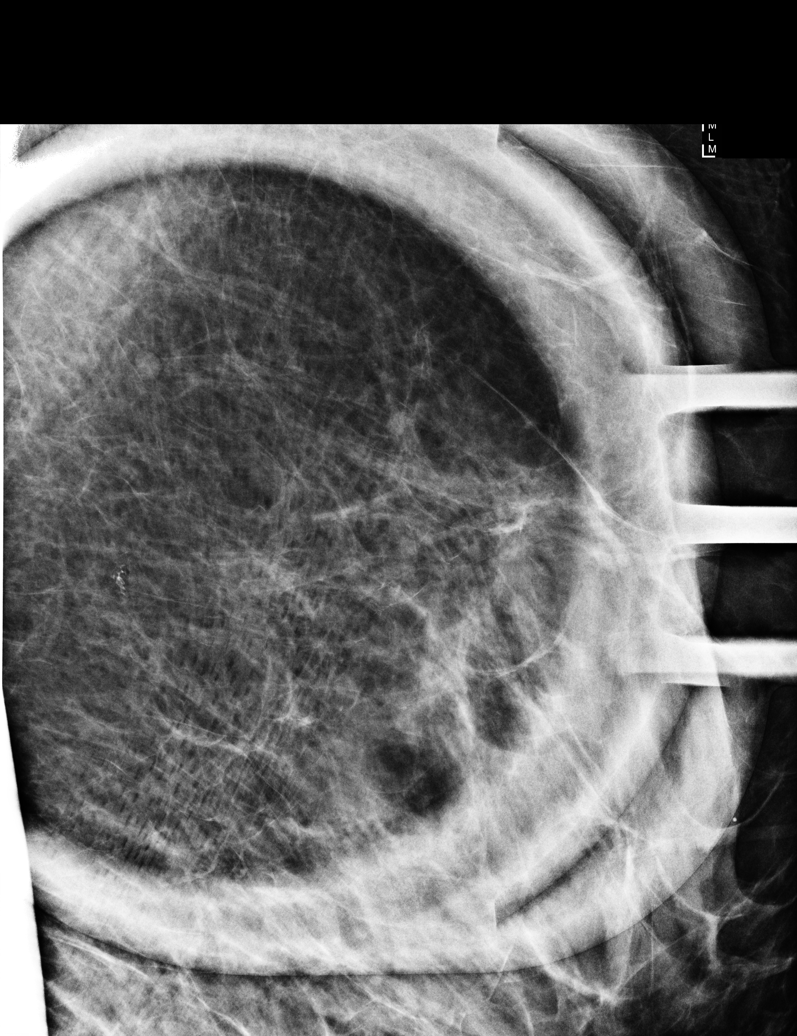

[L CC]
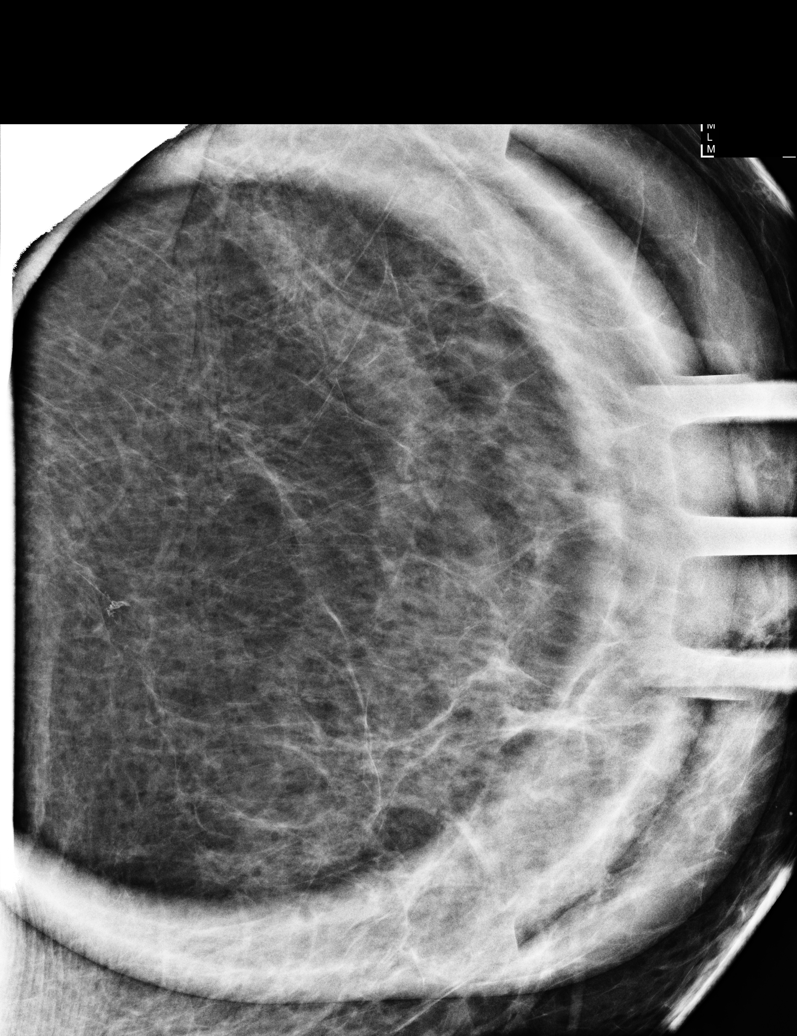

[L CC synth-2D]
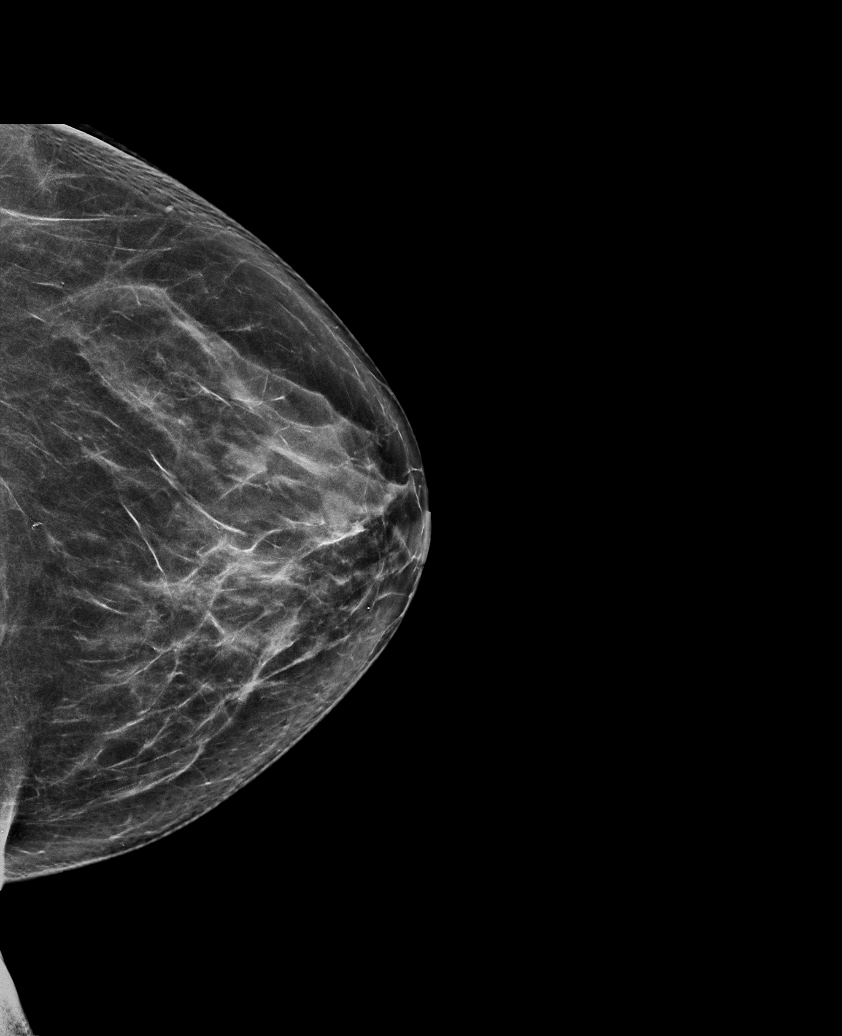

[R MLO synth-2D]
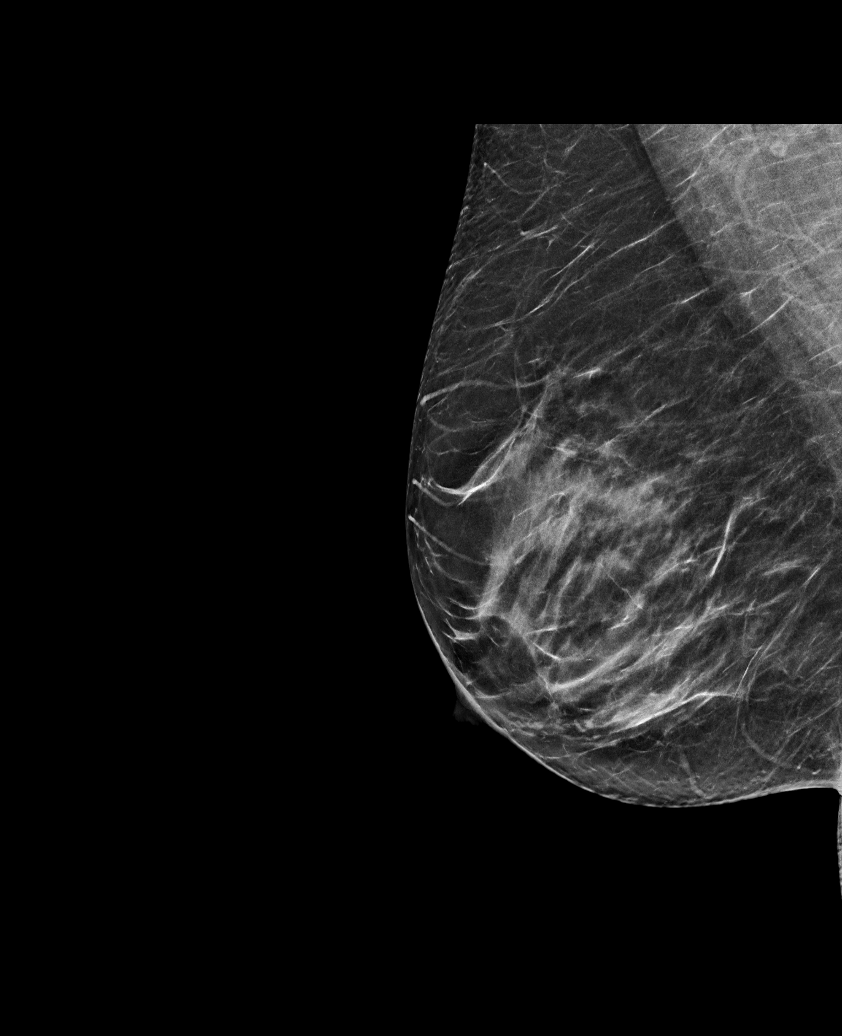

[L MLO synth-2D]
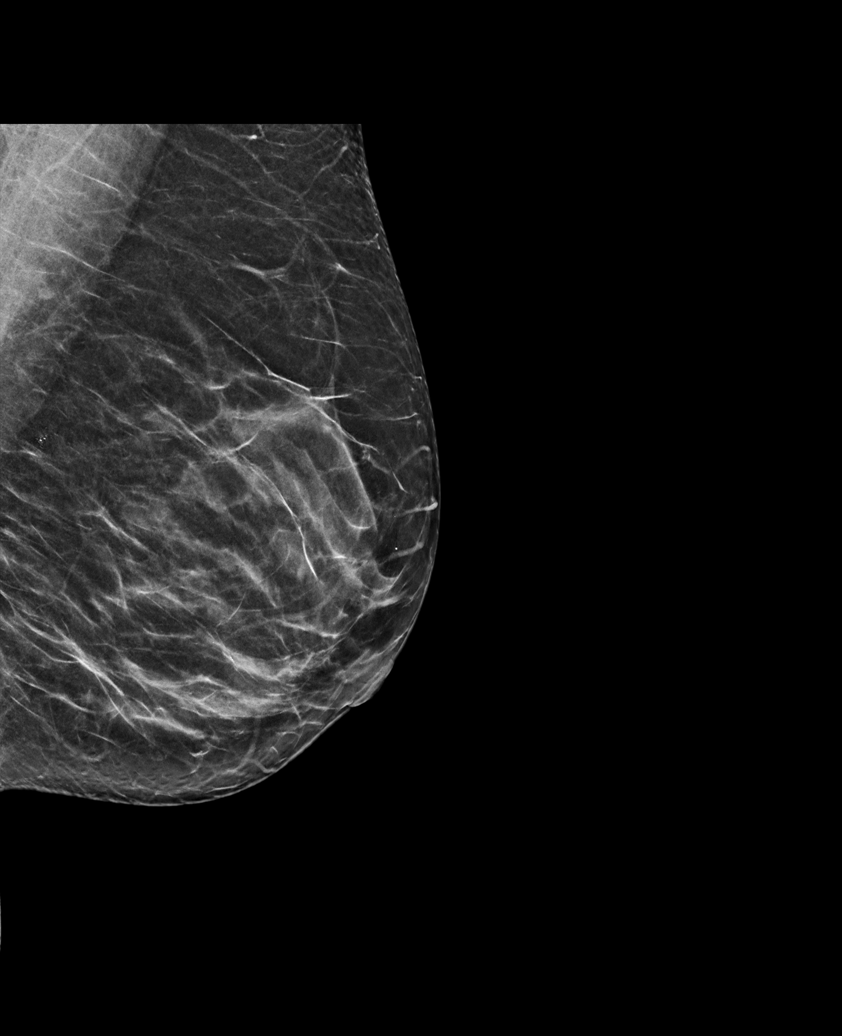

[R CC synth-2D]
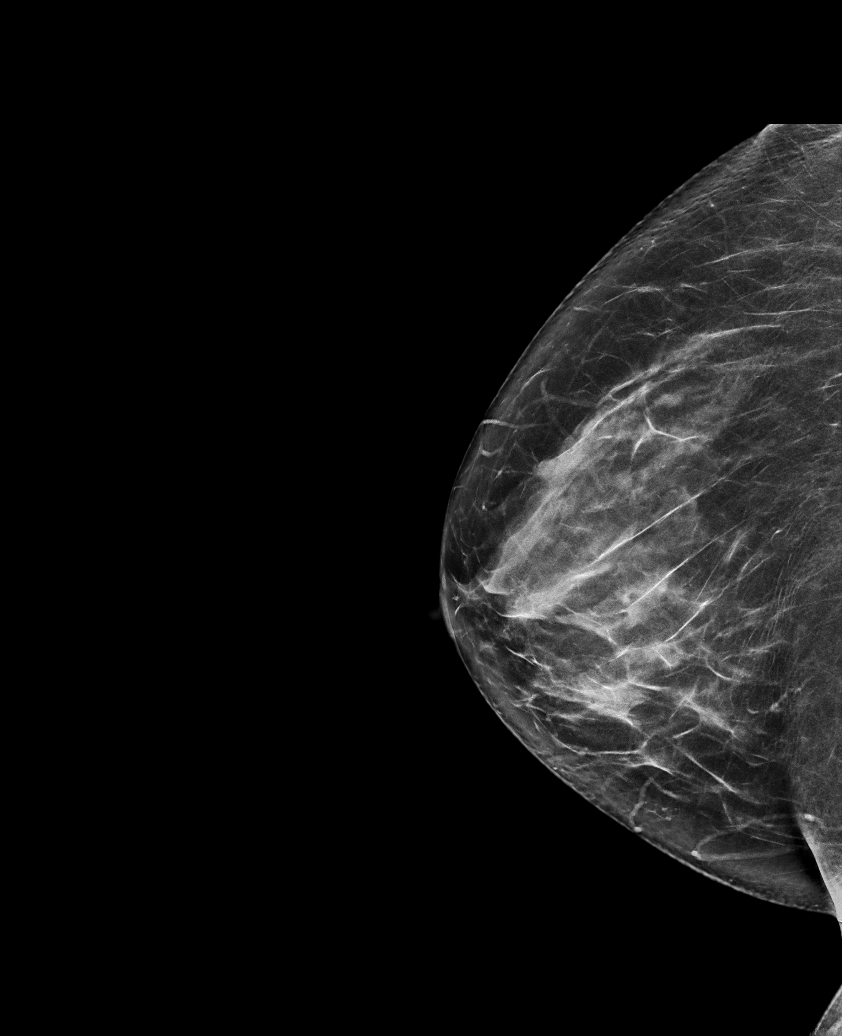

[6 of 26 positions shown; findings below may reference images not displayed]

ACR Breast Density Category c: The breast tissue is heterogeneously
dense, which may obscure small masses.
FINDINGS: RIGHT breast is negative. Magnified views are performed of
calcifications in the posterior central portion of the LEFT breast.
These views demonstrate dystrophic appearing calcifications without
suspicious morphology or distribution. The appearance is stable.

Mammographic images were processed with CAD.
IMPRESSION: Stable appearance of LEFT breast calcifications. One additional
diagnostic mammogram is recommended to complete 3 years of
follow-up.

RECOMMENDATION:
Bilateral diagnostic mammogram in 1 year.

I have discussed the findings and recommendations with the patient.
If applicable, a reminder letter will be sent to the patient
regarding the next appointment.

BI-RADS CATEGORY  3: Probably benign.

## 2022-02-09 ENCOUNTER — Other Ambulatory Visit: Payer: Self-pay | Admitting: Family Medicine

## 2022-02-09 DIAGNOSIS — Z1231 Encounter for screening mammogram for malignant neoplasm of breast: Secondary | ICD-10-CM

## 2022-04-03 ENCOUNTER — Other Ambulatory Visit: Payer: Self-pay | Admitting: Obstetrics and Gynecology

## 2022-04-03 DIAGNOSIS — Z1231 Encounter for screening mammogram for malignant neoplasm of breast: Secondary | ICD-10-CM

## 2022-05-29 ENCOUNTER — Ambulatory Visit: Payer: BC Managed Care – PPO

## 2022-05-29 IMAGING — CT CT CARDIAC CORONARY ARTERY CALCIUM SCORE
3 series · 14 of 20 positions shown, 15 images · non-contrast
Comparison: None.
COMPARISON: None.

Addendum:
EXAM:
OVER-READ INTERPRETATION  CT CHEST

The following report is an over-read performed by radiologist Dr.
Gertrudis Kearse [REDACTED] on 06/13/2020. This
over-read does not include interpretation of cardiac or coronary
anatomy or pathology. The coronary calcium score interpretation by
the cardiologist is attached.
CLINICAL DATA: Risk stratification
Coronary Calcium Score
TECHNIQUE: The patient was scanned on a Siemens Sensation 16 slice scanner.
Axial non-contrast 3mm slices were carried out through the heart.
The data set was analyzed on a dedicated work station and scored
using the Agatson method.

[Series 2: casc 3.0 bv41 2 bestdiast 72 % · axial · 0.38mm/px · z∈[-217,-154]mm · 4 of 37 slices shown, 5 images]
[im 8/37  vessel]
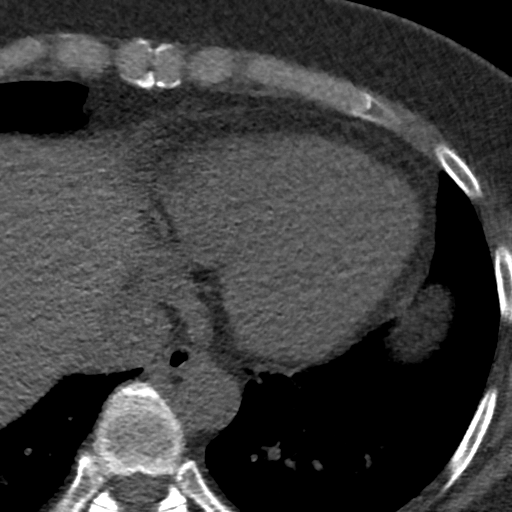
[im 8/37  lung]
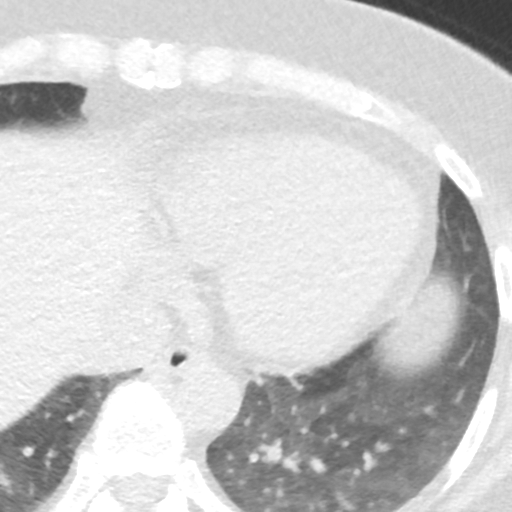
[im 15/37  vessel]
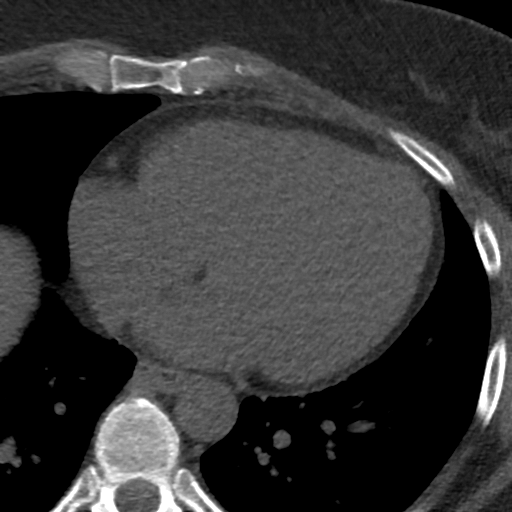
[im 22/37  vessel]
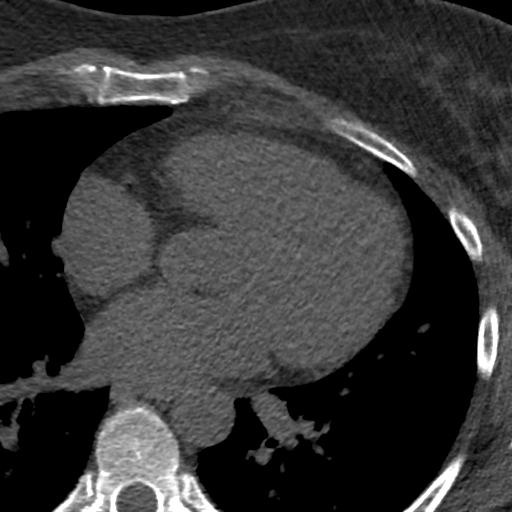
[im 29/37  vessel]
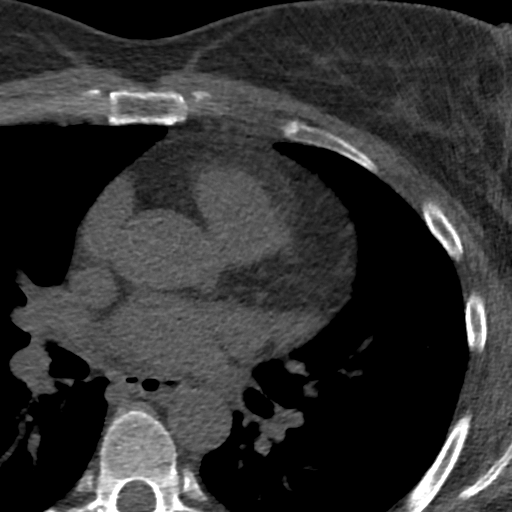

[Series 3: lung 73 % · axial · 0.68mm/px · z∈[-220,-148]mm · 5 of 37 slices shown]
[im 7/37  lung]
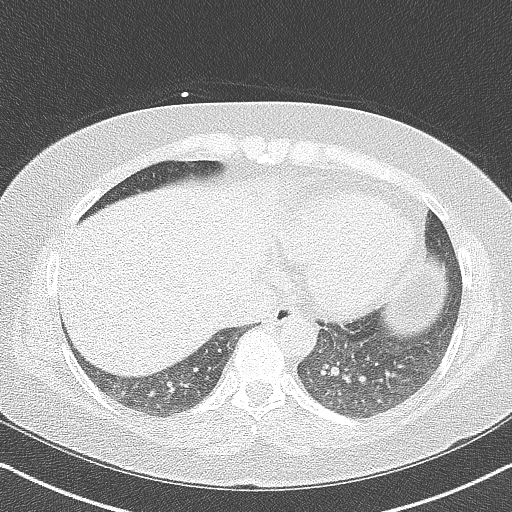
[im 13/37  lung]
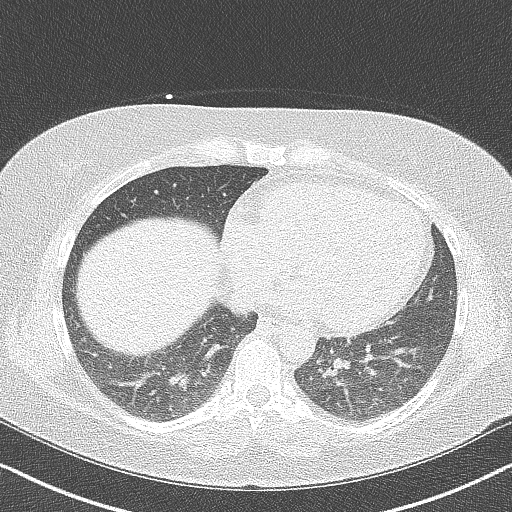
[im 19/37  lung]
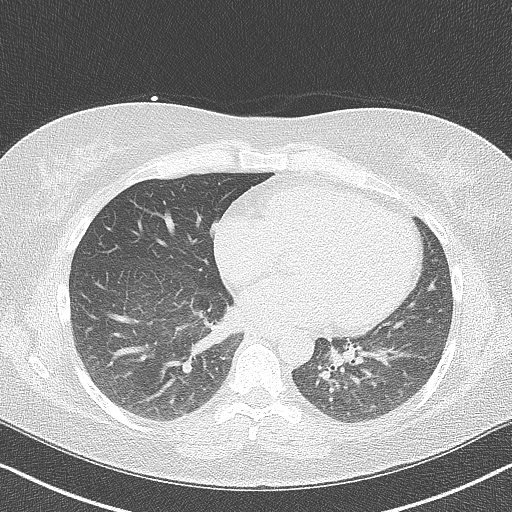
[im 25/37  lung]
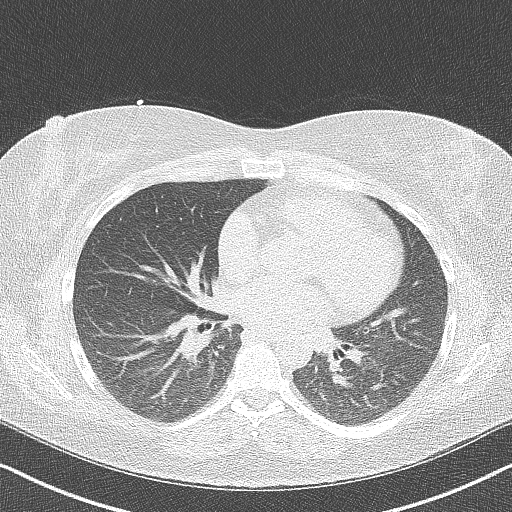
[im 31/37  lung]
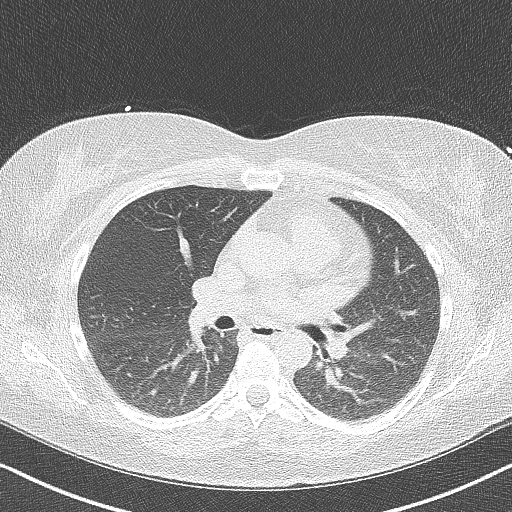

[Series 4: lung st 73 % · axial · 0.68mm/px · z∈[-220,-148]mm · 5 of 37 slices shown]
[im 7/37  lung]
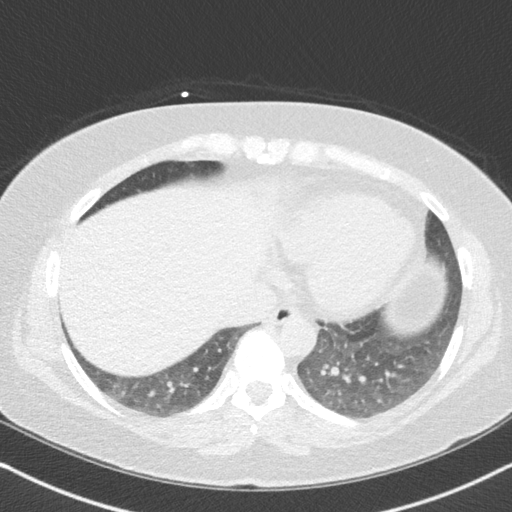
[im 13/37  lung]
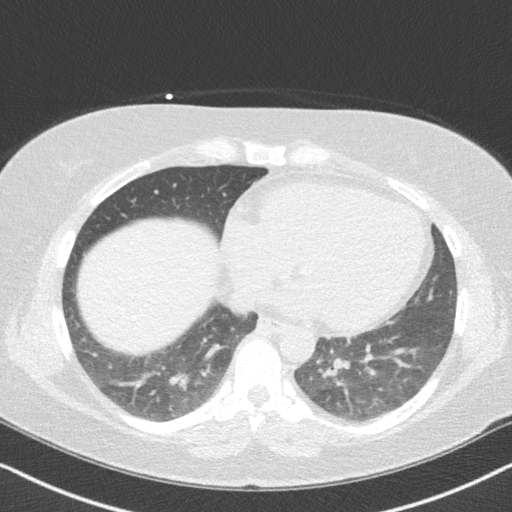
[im 19/37  lung]
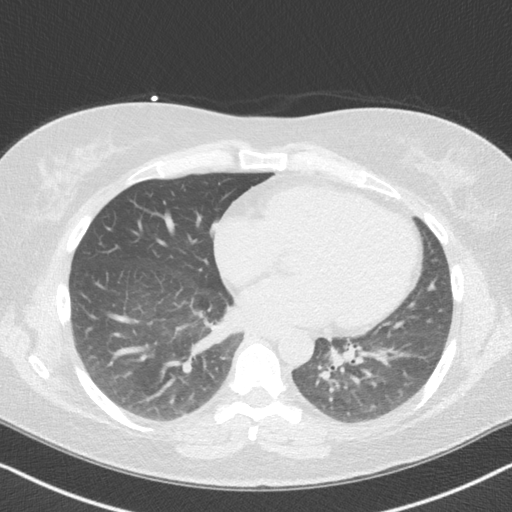
[im 25/37  lung]
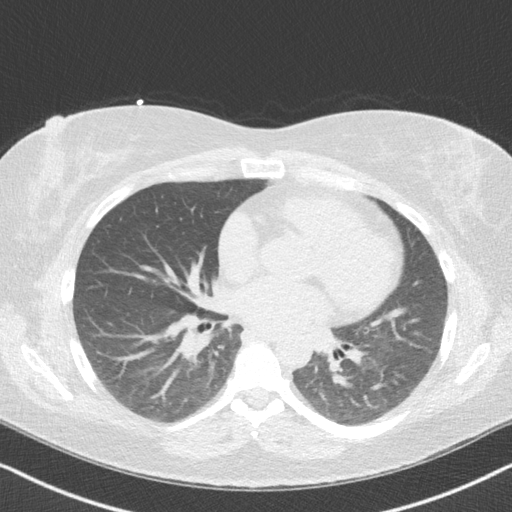
[im 31/37  lung]
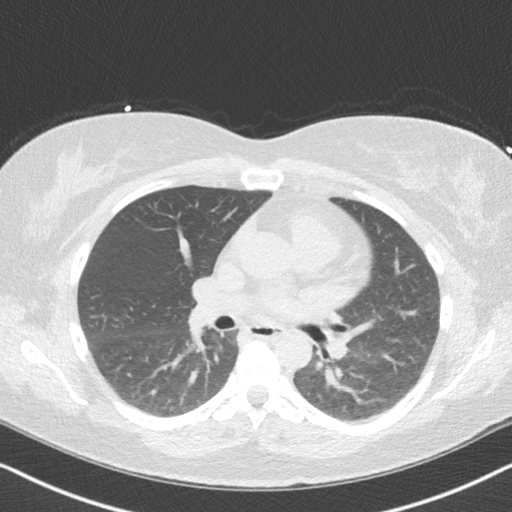

[14 of 20 positions shown; findings below may reference images not displayed]

FINDINGS: Within the visualized portions of the thorax there are no suspicious
appearing pulmonary nodules or masses, there is no acute
consolidative airspace disease, no pleural effusions, no
pneumothorax and no lymphadenopathy. Visualized portions of the
upper abdomen are unremarkable. There are no aggressive appearing
lytic or blastic lesions noted in the visualized portions of the
skeleton.
IMPRESSION: No significant incidental noncardiac findings are noted.
FINDINGS: Non-cardiac: See separate report from [REDACTED].

Ascending Aorta: Normal caliber

Pericardium: Normal

Coronary arteries: Normal origins
IMPRESSION: Coronary calcium score of 0. This suggests low risk for future
cardiac events.

Danii Aujla

*** End of Addendum ***
EXAM:
OVER-READ INTERPRETATION  CT CHEST

The following report is an over-read performed by radiologist Dr.
Gertrudis Kearse [REDACTED] on 06/13/2020. This
over-read does not include interpretation of cardiac or coronary
anatomy or pathology. The coronary calcium score interpretation by
the cardiologist is attached.
FINDINGS: Within the visualized portions of the thorax there are no suspicious
appearing pulmonary nodules or masses, there is no acute
consolidative airspace disease, no pleural effusions, no
pneumothorax and no lymphadenopathy. Visualized portions of the
upper abdomen are unremarkable. There are no aggressive appearing
lytic or blastic lesions noted in the visualized portions of the
skeleton.
IMPRESSION: No significant incidental noncardiac findings are noted.
